# Patient Record
Sex: Female | Born: 1980 | Race: Black or African American | Hispanic: No | Marital: Single | Smoking: Never smoker
Health system: Southern US, Community
[De-identification: ages and names within clinical notes are randomized; demographics above are authoritative.]

## PROBLEM LIST (undated history)

## (undated) DIAGNOSIS — D649 Anemia, unspecified: Secondary | ICD-10-CM

## (undated) DIAGNOSIS — K219 Gastro-esophageal reflux disease without esophagitis: Secondary | ICD-10-CM

## (undated) DIAGNOSIS — T7840XA Allergy, unspecified, initial encounter: Secondary | ICD-10-CM

## (undated) DIAGNOSIS — F419 Anxiety disorder, unspecified: Secondary | ICD-10-CM

## (undated) HISTORY — DX: Allergy, unspecified, initial encounter: T78.40XA

## (undated) HISTORY — DX: Anemia, unspecified: D64.9

## (undated) HISTORY — DX: Gastro-esophageal reflux disease without esophagitis: K21.9

## (undated) HISTORY — PX: MOUTH SURGERY: SHX715

---

## 2010-06-02 ENCOUNTER — Emergency Department (HOSPITAL_COMMUNITY): Admission: EM | Admit: 2010-06-02 | Discharge: 2010-06-03 | Payer: Self-pay | Admitting: Emergency Medicine

## 2011-11-03 ENCOUNTER — Ambulatory Visit: Payer: Self-pay

## 2011-11-03 DIAGNOSIS — J111 Influenza due to unidentified influenza virus with other respiratory manifestations: Secondary | ICD-10-CM

## 2012-02-10 ENCOUNTER — Ambulatory Visit: Payer: BC Managed Care – PPO | Admitting: Family Medicine

## 2012-02-10 VITALS — BP 123/83 | HR 86 | Temp 97.4°F | Resp 18 | Ht 65.5 in | Wt 104.8 lb

## 2012-02-10 DIAGNOSIS — R634 Abnormal weight loss: Secondary | ICD-10-CM

## 2012-02-10 DIAGNOSIS — R5383 Other fatigue: Secondary | ICD-10-CM

## 2012-02-10 LAB — POCT CBC
HCT, POC: 35.5 % — AB (ref 37.7–47.9)
MCH, POC: 30.1 pg (ref 27–31.2)
MCV: 94.4 fL (ref 80–97)
MID (cbc): 0.3 (ref 0–0.9)
Platelet Count, POC: 285 10*3/uL (ref 142–424)
RBC: 3.76 M/uL — AB (ref 4.04–5.48)
WBC: 6.2 10*3/uL (ref 4.6–10.2)

## 2012-02-10 NOTE — Progress Notes (Signed)
Patient Name: Jodi Bishop Date of Birth: [birth year] Medical Record Number: [id number] Gender: female Date of Encounter: 02/10/2012  History of Present Illness:  Jodi Bishop is a 31 y.o. very pleasant female patient who presents with the following:  Notes that her energy had "bottomed out" for 3 or 4 days.  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  No other symptoms such as fever, cough, ST.  She did note a touch of diarrhea but thinks this may be due to he activia yogurt.  She has been working overtime for the past few weeks due to tax time.  Things are now calming down at her job.    Yesterday she noted that her arms felt weak and tingly, her legs felt weak for a while after she arrived at work.  Her hands were shaking.  She had not eaten breakfast yet yesterday when she had her symptoms.    No health problems in general that she is aware of.   Here today with her father.  He points out that she stopped eating meat (still eats fish) about a year ago and has lost 10lbs since then.    Menses started 2 days ago and continue now.   There is no problem list on file for this patient.  No past medical history on file. No past surgical history on file. History  Substance Use Topics  . Smoking status: Never Smoker   . Smokeless tobacco: Not on file  . Alcohol Use: Not on file   No family history on file. No Known Allergies  Medication list has been reviewed and updated.  Review of Systems: As per HPI- otherwise negative.   Physical Examination: Filed Vitals:   02/10/12 1048  BP: 123/83  Pulse: 86  Temp: 97.4 F (36.3 C)  TempSrc: Oral  Resp: 18  Height: 5' 5.5" (1.664 m)  Weight: 104 lb 12.8 oz (47.537 kg)    Body mass index is 17.17 kg/(m^2).  GEN: WDWN, NAD, Non-toxic, A & O x 3, very slim build HEENT: Atraumatic, Normocephalic. Neck supple. No masses, No LAD.  TM, oropharynx wnl.  Ears and Nose: No external deformity. CV: RRR, No M/G/R. No JVD. No  thrill. No extra heart sounds. PULM: CTA B, no wheezes, crackles, rhonchi. No retractions. No resp. distress. No accessory muscle use. ABD: S, NT, ND, +BS. No rebound. No HSM. EXTR: No c/c/e NEURO Normal gait. Normal strength, tone, sensation and DTR all extremities PSYCH: Normally interactive. Conversant. Not depressed or anxious appearing.  Calm demeanor.   Results for orders placed in visit on 02/10/12  POCT CBC      Component Value Range   WBC 6.2  4.6 - 10.2 (K/uL)   Lymph, poc 2.6  0.6 - 3.4    POC LYMPH PERCENT 42.1  10 - 50 (%L)   MID (cbc) 0.3  0 - 0.9    POC MID % 4.9  0 - 12 (%M)   POC Granulocyte 3.3  2 - 6.9    Granulocyte percent 53.0  37 - 80 (%G)   RBC 3.76 (*) 4.04 - 5.48 (M/uL)   Hemoglobin 11.3 (*) 12.2 - 16.2 (g/dL)   HCT, POC 40.9 (*) 81.1 - 47.9 (%)   MCV 94.4  80 - 97 (fL)   MCH, POC 30.1  27 - 31.2 (pg)   MCHC 31.8  31.8 - 35.4 (g/dL)   RDW, POC 91.4     Platelet Count, POC 285  142 -  424 (K/uL)   MPV 7.1  0 - 99.8 (fL)  GLUCOSE, POCT (MANUAL RESULT ENTRY)      Component Value Range   POC Glucose 121    POCT GLYCOSYLATED HEMOGLOBIN (HGB A1C)      Component Value Range   Hemoglobin A1C 5.1      Assessment and Plan: 1. Fatigue  POCT CBC, POCT glucose (manual entry)  2. Weight loss  POCT glycosylated hemoglobin (Hb A1C)   Suspect fatigue and weight loss may be due to increased work and lack of nutrition.  Encouraged more frequent meals with plenty of protein.  She asked to be out of work today and tomorrow to rest and this seems reasonable.  Mild anemia may be diet or menses related- encouraged more iron in her diet.  Lazariah will call or come back again if her symptoms are not resolved in the next week or two- at that point further lab evaluation may be necessary

## 2012-02-15 ENCOUNTER — Ambulatory Visit: Payer: BC Managed Care – PPO | Admitting: Family Medicine

## 2012-02-15 VITALS — BP 120/82 | HR 86 | Temp 98.7°F | Resp 20 | Ht 66.5 in | Wt 107.0 lb

## 2012-02-15 DIAGNOSIS — R5383 Other fatigue: Secondary | ICD-10-CM

## 2012-02-15 DIAGNOSIS — R5381 Other malaise: Secondary | ICD-10-CM

## 2012-02-15 LAB — POCT URINE PREGNANCY: Preg Test, Ur: NEGATIVE

## 2012-02-15 NOTE — Progress Notes (Signed)
  Patient Name: Jodi Bishop Date of Birth: 05/06/1981 Medical Record Number: [id number] Gender: female Date of Encounter: 02/15/2012  History of Present Illness:  Jodi Bishop is a 31 y.o. very pleasant female patient who presents with the following:  Here to recheck fatigue today- see OV last week.  She has continued to feel very fatigued.  She was here a few days ago and had a limited evaluation due to concern over cost.  Since her last visit Jodi Bishop continues to feel fatigued although she had felt better today.  She went to work today but felt very tired and came home after 30 minutes due to feeling "lightheaded."  It seems that her symptoms always get worse when she arrives at her job. In fact, as long as she is at home she seems to feel ok.  She denies feeling anxious or overly stressed about her work.    No CP, no palpitations.  Here today with her father again.    There is no problem list on file for this patient.  No past medical history on file. No past surgical history on file. History  Substance Use Topics  . Smoking status: Never Smoker   . Smokeless tobacco: Not on file  . Alcohol Use: Not on file   No family history on file. No Known Allergies  Medication list has been reviewed and updated.  Review of Systems: As per HPI- otherwise negative. Has been working on eating more- especially eating breakfast  Physical Examination: Filed Vitals:   02/15/12 1740  BP: 115/72  Pulse: 92  Temp: 98.7 F (37.1 C)  TempSrc: Oral  Resp: 20  Height: 5' 6.5" (1.689 m)  Weight: 107 lb (48.535 kg)    Body mass index is 17.01 kg/(m^2).  GEN: WDWN, NAD, Non-toxic, A & O x 3- looks very well, cheerful and laughing  HEENT: Atraumatic, Normocephalic. Neck supple. No masses, No LAD. Ears and Nose: No external deformity. CV: RRR, No M/G/R. No JVD. No thrill. No extra heart sounds. PULM: CTA B, no wheezes, crackles, rhonchi. No retractions. No resp. distress. No accessory muscle  use. ABD: S, NT, ND, +BS. No rebound. No HSM. EXTR: No c/c/e NEURO Normal gait.  PSYCH: Normally interactive. Conversant. Not depressed or anxious appearing.  Calm demeanor.   See orthostatics  Results for orders placed in visit on 02/15/12  POCT URINE PREGNANCY      Component Value Range   Preg Test, Ur Negative      Assessment and Plan: 1. Fatigue  POCT urine pregnancy, Comprehensive metabolic panel, TSH, Epstein-Barr virus VCA antibody panel   Jaeleigh's symptoms really seem to be tied to her job- this makes me wonder if anxiety is playing a role.  Will further evaluate as above.  Did give her a note for the several days of work that she missed last week.  She will try and return to work tomorrow for at least a half day- plan half days tomorrow and Wednesday, then return to full days.  If she gets worse in the meantime please call.

## 2012-02-16 LAB — COMPREHENSIVE METABOLIC PANEL
ALT: 14 U/L (ref 0–35)
AST: 19 U/L (ref 0–37)
Albumin: 4.9 g/dL (ref 3.5–5.2)
CO2: 23 mEq/L (ref 19–32)
Calcium: 9.9 mg/dL (ref 8.4–10.5)
Chloride: 104 mEq/L (ref 96–112)
Creat: 0.68 mg/dL (ref 0.50–1.10)
Potassium: 3.9 mEq/L (ref 3.5–5.3)
Sodium: 139 mEq/L (ref 135–145)
Total Protein: 8.6 g/dL — ABNORMAL HIGH (ref 6.0–8.3)

## 2012-02-16 LAB — TSH: TSH: 1.176 u[IU]/mL (ref 0.350–4.500)

## 2012-02-22 ENCOUNTER — Ambulatory Visit: Payer: BC Managed Care – PPO | Admitting: Internal Medicine

## 2012-02-22 ENCOUNTER — Telehealth: Payer: Self-pay

## 2012-02-22 VITALS — BP 114/77 | HR 80 | Temp 98.2°F | Resp 16 | Ht 66.0 in | Wt 107.4 lb

## 2012-02-22 DIAGNOSIS — R5381 Other malaise: Secondary | ICD-10-CM

## 2012-02-22 DIAGNOSIS — R5383 Other fatigue: Secondary | ICD-10-CM

## 2012-02-22 DIAGNOSIS — D649 Anemia, unspecified: Secondary | ICD-10-CM

## 2012-02-22 LAB — POCT CBC
Granulocyte percent: 48.6 %G (ref 37–80)
Lymph, poc: 2.8 (ref 0.6–3.4)
MCHC: 31.3 g/dL — AB (ref 31.8–35.4)
MID (cbc): 0.5 (ref 0–0.9)
POC Granulocyte: 3.1 (ref 2–6.9)
POC LYMPH PERCENT: 43 %L (ref 10–50)
POC MID %: 8.4 %M (ref 0–12)
Platelet Count, POC: 369 10*3/uL (ref 142–424)
RDW, POC: 13.9 %

## 2012-02-22 MED ORDER — VENLAFAXINE HCL ER 75 MG PO CP24: 75.0000 mg | ORAL_CAPSULE | Freq: Every day | ORAL | Status: DC

## 2012-02-22 NOTE — Progress Notes (Signed)
  Subjective:    Patient ID: Jodi Bishop, female    DOB: 1980-10-30, 31 y.o.   MRN: [id number]  HPI  Jodi Bishop is here with her father today stating that she is having episodes of her energy going up and down. This is her third visit for this problem.   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  She denies insomnia, anhedonia,relationship changes, death in the family/pet, or any grief reactions. Sometimes she has moments of feeling lightheaded.   She does not take iron but has been eating a lot of iron rich foods and spinach.  She is interactive, smiles, has good eye contract during our discussion.  She has no idea why she is so tired, but she notes it started after one long day April 15th and has been recurring since then.   Review of Systems  Constitutional: Positive for fatigue. Negative for fever, activity change and appetite change.  Respiratory: Negative.   Cardiovascular: Negative.   Gastrointestinal: Negative.   Genitourinary: Negative.   Musculoskeletal: Negative.   Neurological: Positive for light-headedness.  Hematological: Negative.   Psychiatric/Behavioral: Negative.   All other systems reviewed and are negative.       Objective:   Physical Exam  Vitals reviewed. Constitutional: She is oriented to person, place, and time. She appears well-developed and well-nourished.       Thin, with BMI 17.  HENT:  Head: Normocephalic.  Eyes: Conjunctivae are normal. Pupils are equal, round, and reactive to light.  Neck: Neck supple.  Cardiovascular: Normal rate, regular rhythm and normal heart sounds.   Pulmonary/Chest: Effort normal and breath sounds normal.  Abdominal: Soft.  Neurological: She is alert and oriented to person, place, and time.  Skin: Skin is warm and dry.  Psychiatric: She has a normal mood and affect. Her behavior is normal. Judgment and thought content normal.          Assessment & Plan:  CBC and Ferritin done  today. Mild anemia with a very slight improvement in her HGB to 11.5.  Disregard in-house ferritin ordered, this test will be sent out to the lab for evaluation. Fatigue that is episodic and from her history occuring in her work place mostly.  Occasionally she has lightheadedness but there is no family history of MS, or vision changes.  One aunt has lupus.  OOW note is written from 02/16/12-02/22/12. She is advised to come into the clinic if she misses any more work as we cannot write her out for a missed week after the fact.  Pt should start Effexor 75 mg daily and return to work tomorrow.  If not improved RTC to see Dr. Patsy Lager in 2-4 weeks.  AVS printed and given pt.

## 2012-02-22 NOTE — Telephone Encounter (Signed)
Can we give this note to her?

## 2012-02-22 NOTE — Telephone Encounter (Signed)
Called Jodi Bishop back and talked to her.  She was last seen here a week ago, and when I called to follow-up and discuss labs she had said that she was feeling better.  However, she still has not returned to work and states that her energy has "peaks and valleys" and that she has not felt well enough to go to work.  I advised her that if she is still too ill to return to work we really need to see her and be sure there is nothing else going on that is making her so ill.  We need to re-evalute her before we can give her a note excusing her from the last 2 weeks of work.  She agreed to [facility]

## 2012-02-22 NOTE — Telephone Encounter (Signed)
PT STATES SHE WOULD LIKE TO HAVE A NOTE FAXED OVER TO HER JOB EXCUSING HER FROM WORK ON 4-16, 4-17, 4-18, 4-19 AND 4-22 UNTIL 4-26 AND ALSO FOR TODAY STATES DR COPLAND IS AWARE OF IT. PLEASE CALL PT AT [phone] AND THE FAX IS [phone] ATTN: KIM CRADDOCK

## 2012-02-22 NOTE — Patient Instructions (Signed)
Take your Effexor 75 mg every morning with some food.  Recheck with Dr. Patsy Lager in 2-4 weeks.

## 2012-02-23 LAB — HIV ANTIBODY (ROUTINE TESTING W REFLEX): HIV: NONREACTIVE

## 2012-02-24 ENCOUNTER — Telehealth: Payer: Self-pay | Admitting: Radiology

## 2012-02-24 NOTE — Telephone Encounter (Signed)
Message copied by Luretha Murphy on Wed Feb 24, 2012 11:27 AM ------      Message from: Eddie Candle E      Created: Wed Feb 24, 2012  8:53 AM       Please tell patient her iron is very low, only 10 and I would like her to try an iron supplement or multivitamin with iron to improve her energy level.  Her HIV test was negative and other labs were normal.  thanks

## 2012-02-24 NOTE — Telephone Encounter (Signed)
Gave pt labs.

## 2012-03-21 ENCOUNTER — Ambulatory Visit: Payer: BC Managed Care – PPO | Admitting: Family Medicine

## 2012-03-21 VITALS — BP 101/69 | HR 80 | Temp 98.1°F | Resp 16 | Ht 66.5 in | Wt 108.0 lb

## 2012-03-21 DIAGNOSIS — R5381 Other malaise: Secondary | ICD-10-CM

## 2012-03-21 DIAGNOSIS — R5383 Other fatigue: Secondary | ICD-10-CM

## 2012-03-21 LAB — POCT CBC
Granulocyte percent: 55.8 %G (ref 37–80)
Hemoglobin: 10.9 g/dL — AB (ref 12.2–16.2)
MCH, POC: 30.8 pg (ref 27–31.2)
MPV: 8.3 fL (ref 0–99.8)
POC MID %: 7.5 %M (ref 0–12)
Platelet Count, POC: 347 10*3/uL (ref 142–424)
RBC: 3.54 M/uL — AB (ref 4.04–5.48)
WBC: 6 10*3/uL (ref 4.6–10.2)

## 2012-03-21 NOTE — Progress Notes (Signed)
Patient Name: Jodi Bishop Date of Birth: May 30, 1981 Medical Record Number: [id number] Gender: female Date of Encounter: 03/21/2012  History of Present Illness:  Jodi Bishop is a 31 y.o. very pleasant female patient who presents with the following:  Here today to recheck her fatigue.  She never did start the effexor she was given about a month ago.  She notes that she is doing "a lot better."  She occasionally will feel that she is a little lightheaded or tired.  Today (a holiday) she feels "really good."  She has started an iron supplement (actually an "iron water" that does not seem to have a lot of actual iron), and is trying to eat more healthfully.  However, she does note that these symptoms seems to coincide with when she cut most meat out of her diet.  We will recheck her hemoglobin level today.    She also has "papers for her job" for me to fill out.  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 She notes that her "head felt funny" when she first started the iron supplement.  She never passed out but felt faint.  This seems to have improved.  However, she continues to have symptoms of fatigue off an on.  Her new supervisor is "more strict about these things" than her last supervisor, so her need to rest while at work is becoming more of an issue.   She also had to go to HR last Wednesday due to "feeling horrible" and needing to rest longer than he lunch break allowed. She is here to have paperwork filled out "in case I have to step away from the job for a little while."  She does not notice these symptoms when she is at home.  In fact, this weekend she has felt quite well and today "I feel the best I have felt since this started."   LMP last week  There is no problem list on file for this patient.  No past medical history on file. No past surgical history on file. History  Substance Use Topics  . Smoking status: Never Smoker   . Smokeless  tobacco: Not on file  . Alcohol Use: Not on file   No family history on file. No Known Allergies  Medication list has been reviewed and updated.  Review of Systems: As per HPI- otherwise negative.   Physical Examination: Filed Vitals:   03/21/12 1454  BP: 101/69  Pulse: 80  Temp: 98.1 F (36.7 C)  TempSrc: Oral  Resp: 16  Height: 5' 6.5" (1.689 m)  Weight: 108 lb (48.988 kg)    Body mass index is 17.17 kg/(m^2).  GEN: WDWN, NAD, Non-toxic, A & O x 3, talkative, cheerful HEENT: Atraumatic, Normocephalic. Neck supple. No masses, No LAD. Ears and Nose: No external deformity. CV: RRR, No M/G/R. No JVD. No thrill. No extra heart sounds. PULM: CTA B, no wheezes, crackles, rhonchi. No retractions. No resp. distress. No accessory muscle use. EXTR: No c/c/e NEURO Normal gait.  PSYCH: Normally interactive. Conversant. Not depressed or anxious appearing.    Results for orders placed in visit on 03/21/12  POCT CBC      Component Value Range   WBC 6.0  4.6 - 10.2 (K/uL)   Lymph, poc 2.2  0.6 - 3.4    POC LYMPH PERCENT 36.7  10 - 50 (%L)   MID (cbc) 0.4  0 - 0.9  POC MID % 7.5  0 - 12 (%M)   POC Granulocyte 3.3  2 - 6.9    Granulocyte percent 55.8  37 - 80 (%G)   RBC 3.54 (*) 4.04 - 5.48 (M/uL)   Hemoglobin 10.9 (*) 12.2 - 16.2 (g/dL)   HCT, POC 45.4 (*) 09.8 - 47.9 (%)   MCV 95.4  80 - 97 (fL)   MCH, POC 30.8  27 - 31.2 (pg)   MCHC 32.2  31.8 - 35.4 (g/dL)   RDW, POC 11.9     Platelet Count, POC 347  142 - 424 (K/uL)   MPV 8.3  0 - 99.8 (fL)    Assessment and Plan: She still has a slight anemia. I do not think that the iron water she is taking is probably sufficient- recommended that she start a regular iron supplement of 325mg / day which is available OTC.  Recommend that she start eating meat again as this may help to improve her energy level. It is concerning to me that thse symptoms only seem to occur when she is at work- consider anxiety attacks. I wish that she had  tried the effexor she was given.  Did complete her paperwork so that she may miss up to 2 hours per week at work.  This will expire on June 30th- explained to her that if her symptoms continue to cause her to miss work we will need to evaluate her further.  Plan to recheck in about one month to see how her iron level is doing with a standard supplement. Sooner if worse.

## 2012-03-23 ENCOUNTER — Telehealth: Payer: Self-pay | Admitting: *Deleted

## 2012-03-23 NOTE — Telephone Encounter (Signed)
Per Dr. Patsy Lager, called patient and left a message regarding FMLA form for additional time off.  Per their conversation at office visit yesterday (03/22/12), Dr. Patsy Lager re-emphasized she would ok up to 2 hours per week for one month.  Patient is to call the office back if she has any questions.  Angie Calandria Mullings, CMA.

## 2013-01-27 ENCOUNTER — Ambulatory Visit: Payer: No Typology Code available for payment source | Admitting: Internal Medicine

## 2013-01-27 VITALS — BP 130/76 | HR 71 | Temp 98.0°F | Resp 18 | Wt 127.0 lb

## 2013-01-27 DIAGNOSIS — R5381 Other malaise: Secondary | ICD-10-CM

## 2013-01-27 DIAGNOSIS — B349 Viral infection, unspecified: Secondary | ICD-10-CM

## 2013-01-27 DIAGNOSIS — D649 Anemia, unspecified: Secondary | ICD-10-CM

## 2013-01-27 DIAGNOSIS — B9789 Other viral agents as the cause of diseases classified elsewhere: Secondary | ICD-10-CM

## 2013-01-27 LAB — POCT CBC
HCT, POC: 39.8 % (ref 37.7–47.9)
Lymph, poc: 2.3 (ref 0.6–3.4)
MCH, POC: 31.5 pg — AB (ref 27–31.2)
MCHC: 31.7 g/dL — AB (ref 31.8–35.4)
MCV: 99.5 fL — AB (ref 80–97)
MID (cbc): 0.4 (ref 0–0.9)
POC LYMPH PERCENT: 34.5 %L (ref 10–50)
Platelet Count, POC: 320 10*3/uL (ref 142–424)
RDW, POC: 12.5 %
WBC: 6.6 10*3/uL (ref 4.6–10.2)

## 2013-01-27 NOTE — Progress Notes (Signed)
  Subjective:    Patient ID: Jodi Bishop, female    DOB: 20-Jul-1981, 32 y.o.   MRN: [id number]  HPI C/o 1 week of head congestion and fatigue.Hx of anemia and on iron reolacement. No sob, cp, dizzyness.   Review of Systems normal    Objective:   Physical Exam  Vitals reviewed. Constitutional: She is oriented to person, place, and time. She appears well-developed and well-nourished.  HENT:  Right Ear: External ear normal.  Left Ear: External ear normal.  Nose: Nose normal.  Mouth/Throat: Oropharynx is clear and moist.  Eyes: EOM are normal. Pupils are equal, round, and reactive to light.  Neck: Neck supple. No thyromegaly present.  Cardiovascular: Normal rate, regular rhythm and normal heart sounds.   Lymphadenopathy:    She has no cervical adenopathy.  Neurological: She is alert and oriented to person, place, and time. She has normal reflexes. She exhibits normal muscle tone. Coordination normal.  Psychiatric: She has a normal mood and affect. Her behavior is normal.     Results for orders placed in visit on 01/27/13  POCT CBC      Result Value Range   WBC 6.6  4.6 - 10.2 K/uL   Lymph, poc 2.3  0.6 - 3.4   POC LYMPH PERCENT 34.5  10 - 50 %L   MID (cbc) 0.4  0 - 0.9   POC MID % 6.4  0 - 12 %M   POC Granulocyte 3.9  2 - 6.9   Granulocyte percent 59.1  37 - 80 %G   RBC 4.00 (*) 4.04 - 5.48 M/uL   Hemoglobin 12.6  12.2 - 16.2 g/dL   HCT, POC 40.9  81.1 - 47.9 %   MCV 99.5 (*) 80 - 97 fL   MCH, POC 31.5 (*) 27 - 31.2 pg   MCHC 31.7 (*) 31.8 - 35.4 g/dL   RDW, POC 91.4     Platelet Count, POC 320  142 - 424 K/uL   MPV 7.9  0 - 99.8 fL   Anemia resolved     Assessment & Plan:  Fatigue/Post viral syndrome DC iron

## 2013-01-27 NOTE — Patient Instructions (Signed)

## 2013-01-30 ENCOUNTER — Encounter: Payer: Self-pay | Admitting: *Deleted

## 2013-02-16 ENCOUNTER — Emergency Department (HOSPITAL_COMMUNITY)
Admission: EM | Admit: 2013-02-16 | Discharge: 2013-02-16 | Disposition: A | Discharge status: Home / Self Care | Payer: No Typology Code available for payment source | Attending: Emergency Medicine | Admitting: Emergency Medicine

## 2013-02-16 ENCOUNTER — Encounter (HOSPITAL_COMMUNITY): Payer: Self-pay

## 2013-02-16 ENCOUNTER — Emergency Department (HOSPITAL_COMMUNITY): Payer: No Typology Code available for payment source

## 2013-02-16 ENCOUNTER — Other Ambulatory Visit: Payer: Self-pay

## 2013-02-16 DIAGNOSIS — R0602 Shortness of breath: Secondary | ICD-10-CM | POA: Insufficient documentation

## 2013-02-16 DIAGNOSIS — R079 Chest pain, unspecified: Secondary | ICD-10-CM

## 2013-02-16 DIAGNOSIS — R0789 Other chest pain: Secondary | ICD-10-CM | POA: Insufficient documentation

## 2013-02-16 DIAGNOSIS — R000 Tachycardia, unspecified: Secondary | ICD-10-CM | POA: Insufficient documentation

## 2013-02-16 LAB — POCT I-STAT TROPONIN I

## 2013-02-16 LAB — BASIC METABOLIC PANEL
BUN: 13 mg/dL (ref 6–23)
GFR calc Af Amer: >90 mL/min (ref >90)
GFR calc non Af Amer: >90 mL/min (ref >90)
Potassium: 3.2 mEq/L — ABNORMAL LOW (ref 3.5–5.1)

## 2013-02-16 LAB — CBC
HCT: 36 % (ref 36.0–46.0)
MCHC: 36.4 g/dL — ABNORMAL HIGH (ref 30.0–36.0)
RDW: 11.1 % — ABNORMAL LOW (ref 11.5–15.5)

## 2013-02-16 MED ORDER — GI COCKTAIL ~~LOC~~
30.0000 mL | Freq: Once | ORAL | Status: AC
  Administered 2013-02-16: 30 mL via ORAL
  Filled 2013-02-16: qty 30

## 2013-02-16 MED ORDER — FAMOTIDINE 20 MG PO TABS: 20.0000 mg | ORAL_TABLET | Freq: Two times a day (BID) | ORAL | Status: DC

## 2013-02-16 MED ORDER — LORAZEPAM 2 MG/ML IJ SOLN: 1.0000 mg | Freq: Once | INTRAMUSCULAR | Status: DC

## 2013-02-16 NOTE — ED Provider Notes (Signed)
32 year old female, history of chest pain that came on today, has self resolved, no respiratory cardiac disease or pulmonary wasn't, d-dimer normal, labs normal, EKG unremarkable. The transfer from urgent care for mild tachycardia.  On exam patient is clear heart and lung sounds, no tachycardia, appears mildly anxious and states that she gets nervous causes her rate to go up, no signs of peripheral edema, patient stable for discharge.  PA and lateral views of the chest were obtained by digital radiography. I have personally interpreted these x-rays and find her to be no signs of pulmonary infiltrate, cardiomegaly, subdiaphragmatic free air, soft tissue abnormality, no obvious bony abnormalities or fractures.   Medical screening examination/treatment/procedure(s) were conducted as a shared visit with non-physician practitioner(s) and myself.  I personally evaluated the patient during the encounter    Vida Roller, MD 02/16/13 2011

## 2013-02-16 NOTE — ED Notes (Addendum)
Pt was at work earlier today and began to experience chest pain that started about one hour after she ate lunch. Pt said the pain is in the central part of her chest now but when the pain began she felt the pain up the front of her neck as well. No vomiting or diarrhea. Pt did experience some shortness of breath with the chest pain. [facility] and they told her to come here.

## 2013-02-16 NOTE — ED Notes (Signed)
Pt resting quietly at this time with no complaints voiced.  Skin warm and dry color appropriated.

## 2013-02-16 NOTE — Discharge Instructions (Signed)
Your work up here is unremarkable. Tylenol for pain. Take pepcid daily for possible gastrointestinal symptoms. If symptoms continue follow up with primary care doctor.    Chest Pain (Nonspecific) It is often hard to give a specific diagnosis for the cause of chest pain. There is always a chance that your pain could be related to something serious, such as a heart attack or a blood clot in the lungs. You need to follow up with your caregiver for further evaluation. CAUSES   Heartburn.  Pneumonia or bronchitis.  Anxiety or stress.  Inflammation around your heart (pericarditis) or lung (pleuritis or pleurisy).  A blood clot in the lung.  A collapsed lung (pneumothorax). It can develop suddenly on its own (spontaneous pneumothorax) or from injury (trauma) to the chest.  Shingles infection (herpes zoster virus). The chest wall is composed of bones, muscles, and cartilage. Any of these can be the source of the pain.  The bones can be bruised by injury.  The muscles or cartilage can be strained by coughing or overwork.  The cartilage can be affected by inflammation and become sore (costochondritis). DIAGNOSIS  Lab tests or other studies, such as X-rays, electrocardiography, stress testing, or cardiac imaging, may be needed to find the cause of your pain.  TREATMENT   Treatment depends on what may be causing your chest pain. Treatment may include:  Acid blockers for heartburn.  Anti-inflammatory medicine.  Pain medicine for inflammatory conditions.  Antibiotics if an infection is present.  You may be advised to change lifestyle habits. This includes stopping smoking and avoiding alcohol, caffeine, and chocolate.  You may be advised to keep your head raised (elevated) when sleeping. This reduces the chance of acid going backward from your stomach into your esophagus.  Most of the time, nonspecific chest pain will improve within 2 to 3 days with rest and mild pain medicine. HOME  CARE INSTRUCTIONS   If antibiotics were prescribed, take your antibiotics as directed. Finish them even if you start to feel better.  For the next few days, avoid physical activities that bring on chest pain. Continue physical activities as directed.  Do not smoke.  Avoid drinking alcohol.  Only take over-the-counter or prescription medicine for pain, discomfort, or fever as directed by your caregiver.  Follow your caregiver's suggestions for further testing if your chest pain does not go away.  Keep any follow-up appointments you made. If you do not go to an appointment, you could develop lasting (chronic) problems with pain. If there is any problem keeping an appointment, you must call to reschedule. SEEK MEDICAL CARE IF:   You think you are having problems from the medicine you are taking. Read your medicine instructions carefully.  Your chest pain does not go away, even after treatment.  You develop a rash with blisters on your chest. SEEK IMMEDIATE MEDICAL CARE IF:   You have increased chest pain or pain that spreads to your arm, neck, jaw, back, or abdomen.  You develop shortness of breath, an increasing cough, or you are coughing up blood.  You have severe back or abdominal pain, feel nauseous, or vomit.  You develop severe weakness, fainting, or chills.  You have a fever. THIS IS AN EMERGENCY. Do not wait to see if the pain will go away. Get medical help at once. Call your local emergency services (911 in U.S.). Do not drive yourself to the hospital. MAKE SURE YOU:   Understand these instructions.  Will watch your condition.  Will get help right away if you are not doing well or get worse. Document Released: 07/22/2005 Document Revised: 01/04/2012 Document Reviewed: 05/17/2008 [facility] Patient Information 2013 [place], [place]. Resolved  RESOURCE GUIDE  Chronic Pain Problems: Contact [facility]  [phone] Patients need to be referred by  their primary care doctor.  Insufficient Money for Medicine: Contact United Way:  call "211."   No Primary Care Doctor: - Call Health Connect  [phone] - can help you locate a primary care doctor that  accepts your insurance, provides certain services, etc. - Physician Referral Service- [phone]  Agencies that provide inexpensive medical care: - [facility]  [phone] - [facility]  [phone] - [facility]  [phone] - Women's Clinic  [phone] - Planned Parenthood  [phone] [facility]  [phone]  Medicaid-accepting [facility] Providers: - [facility]- [address]  [phone], Mon-Fri 9am-7pm, Sat 9am-1pm - [facility]- [address] [place], Suite [place]  [phone] - [facility]- [address], Suite [place]  [phone] [facility]- [address]  [phone] - Renaye Rakers- [address] [place], [address], [phone]  Only accepts [place] patients after they have their name  applied to their card  Self Pay (no insurance) in [place]: - Sickle Cell Patients: Dr Willey Blade, [facility]  [address] [place], [phone] - [facility]- [address] [place]  [phone]       [facility] [place]- [address]       -     [facility]- see information above (Speak to [employer] if you do not have insurance)       -  [facility]- [address],  [phone]       -  [facility]- [address], [phone]       -  Dr Julio Sicks-  [address], [place], [phone]       -  [facility] - [address] [phone]       -  [facility]- [address] [phone], also [address] [phone]       -    [facility]- [address] [place], [phone], 1st & 3rd  Saturday        every month, 10am-1pm  [facility] [address] [place], [place] [address] [phone]  [facility] [address] [place], [place] [address] [phone]  1) Find a Doctor and Pay Out of Pocket Although you won't have to find out who is covered by your insurance plan, it is a good idea to ask around and get recommendations. You will then need to call the office and see if the doctor you have chosen will accept you as a new patient and what types of options they offer for patients who are self-pay. Some doctors offer discounts or will set up payment plans for their patients who do not have insurance, but you will need to ask so you aren't surprised when you get to your appointment.  2) Contact Your Local Health Department Not all health departments have doctors that can see patients for sick visits, but many do, so it  is worth a call to see if yours does. If you don't know where your local health department is, you can check in your phone book. The CDC also has a tool to help you locate your state's health department, and many state websites also have listings of all of their local health departments.  3) Find a Walk-in Clinic If your illness is not likely to be very severe or complicated, you may want to try a walk in clinic. These are popping up all over the country in pharmacies, drugstores, and shopping centers. They're usually staffed by nurse practitioners or physician assistants that have been trained to treat common illnesses and complaints. They're usually fairly quick and inexpensive. However, if you have serious medical issues or chronic medical problems, these are probably not your best option  STD Testing - [facility] Department of [facility] [place], STD Clinic, [address] [place], phone [phone] or [phone].  Monday - Friday, call for an appointment. [facility] Department of [employer], STD Clinic, [place] [address], [place], phone [phone] or [phone].  Monday - Friday, call for an appointment.  Abuse/Neglect: [facility] Child Abuse Hotline (713)435-9294 [facility] Child Abuse Hotline 970-634-9214 (After Hours)  Emergency Shelter:  [facility] 657-002-0770  Maternity Homes: - [facility] [place] [facility] [phone] - [facility] [phone]  MRSA Hotline #:   (832) 142-3876  Dental Assistance If unable to pay or uninsured, contact:  [facility]. to become qualified for the adult dental clinic.  Patients with Medicaid: [facility] [phone] [address], [phone] [address] [phone]  If unable to pay, or uninsured, contact [facility] [phone] in [place], [phone] in [facility]) to become qualified for the adult dental clinic  [facility] [address] [place], [place] [address] [phone] [email or web address]  Other Proofreader Services: - [facility]- [address] [place], [place], [place], [address], [phone], Ext. 123, 2nd and 4th Thursday of the month at 6:30am.  10 clients each day by appointment, can sometimes see walk-in patients if someone does not show for an appointment. [facility]- [address] [place], [place], [address], [phone] - [facility] [address] [place], [place], [address], [phone] - [facility]- [phone] [facility]- [phone] [facility] Health [phone]       Behavioral Health Resources in the [facility]  Intensive Outpatient Programs: [facility]      [address] [place], [place] [phone] Both a day and evening program       [facility] Outpatient     [address]        [place], [place]  [address] [phone]         ADS: [facility] S[facility] [address] [place] [phone]  [facility] Mental Health ACCESS LINE: [phone] or [phone] [address] [place], [place] [address] [email or web address]   Substance Abuse Resources: - [facility] Drug Services  [phone] - [facility] [phone] - [facility] [phone] Floydene Flock [phone] - Residential & Outpatient Substance Abuse Program  [phone]  Psychological Services: [facility]  [phone] [facility] Services  [phone] - [facility], [phone] [place]. [address] [place], ACCESS LINE: [phone] or [phone], [email or web address]  Mobile Crisis Teams:                                        Therapeutic Alternatives         Mobile Crisis Care Unit [phone]             [facility] [address] [place] Otto [phone]                                         Interventionist [address] [place] [phone]  Self-Help/Support Groups: Mental Health Assoc. of [employer] of support groups [phone] (call for more info)   Narcotics Anonymous (NA) Caring Services [address] [place] - 2 meetings at this location  Residential Treatment Programs:  [facility]      [address]        [place]       [phone]         [facility] [address] [place] 010272 [place], [place]  [address] [phone]  [facility]  [address] [place], [place] [address] [phone] Admissions: 8am-3pm M-F  [facility]     [address]        [place], [place] [address]       [phone]         [facility] [address] [place], [place] [phone]  [facility] [address] [place], [place] [phone]  [facility] - Intensive Outpatient      [address]     [place], [place]       [phone]         [facility] ([facility]     [address] [place], [place] [phone] or [phone]  Residential Treatment Services (RTS), Medicaid [address] [place], [place] [phone]  [facility] [address]                                               [address] [place] [phone]  [facility] Resources: [phone]               General Therapy                                                Angie Fava, PhD        [address] [place], [place] [address]         [phone]   Insurance  [facility] Behavioral   [address] [place], [place] [address] [phone]  [facility] [address] [place], [place] [address] [phone] Insurance/Medicaid/sponsorship through [facility]  [facility] Families  [address]                                        [place], [place] [address]    Therapy/tele-psych/case         [phone]          [facility] [address], [place]  [address]  Adolescent/group home/case management [phone]                                           Creola Corn PhD       General therapy       Insurance   [phone]         Dr. Lolly Mustache, [place], M-F [phone]  [facility]  United [employer] [facility]. [address]                 [address]         [address] [place] [address]  Blondell Reveal Phone:  [phone]                                  Phone:  [phone]                   Phone:  [phone]  [facility] Mental Health, [phone] - [facility]  Human Services- [phone]       -     [facility] in [place], [address]             [phone], Insurance  [place] Child Abuse Hotline [phone]) [phone] or [phone] (After Hours)

## 2013-02-16 NOTE — ED Provider Notes (Signed)
History     CSN: [id number]  Arrival date & time 02/16/13  1634   First MD Initiated Contact with Patient 02/16/13 1722      Chief Complaint  Patient presents with  . Chest Pain    (Consider location/radiation/quality/duration/timing/severity/associated sxs/prior treatment) HPI Jodi Bishop is a 32 y.o. female who presents to ED with complaint of chest pain. States pain started while at work, states was just sitting at her computer. Onset was shortly after eating lunch. States pain in the retrosternal area, radiating up the neck.  States pain improved, but still feeling some pressure. States it was associated with shortness of breath. No nausea, diaphoresis, dizziness. States had symptoms similar to this before, also after eating. Denies prior medical problems. Not a smoker. Went to UC, sent here because was tachycardic and had chest pain. Pt denies recent travel, no recent surgeries, does not take OCPs.   Past Medical History  Diagnosis Date  . Allergy     History reviewed. No pertinent past surgical history.  Family History  Problem Relation Age of Onset  . Hypertension Father   . Diabetes Maternal Grandmother   . Cancer Maternal Grandfather     History  Substance Use Topics  . Smoking status: Never Smoker   . Smokeless tobacco: Not on file  . Alcohol Use: No    OB History   Grav Para Term Preterm Abortions TAB SAB Ect Mult Living                  Review of Systems  Respiratory: Positive for shortness of breath. Negative for chest tightness.   Cardiovascular: Positive for chest pain. Negative for palpitations and leg swelling.  Gastrointestinal: Negative.   Genitourinary: Negative for dysuria and flank pain.  Musculoskeletal: Negative.   Neurological: Negative for dizziness, weakness, light-headedness, numbness and headaches.  Psychiatric/Behavioral: Negative.   All other systems reviewed and are negative.    Allergies  Review of patient's allergies  indicates no known allergies.  Home Medications  No current outpatient prescriptions on file.  BP 139/88  Pulse 112  Temp(Src) 98.4 F (36.9 C) (Oral)  Resp 20  SpO2 96%  LMP 02/16/2013  Physical Exam  Nursing note and vitals reviewed. Constitutional: She is oriented to person, place, and time. She appears well-developed and well-nourished. No distress.  HENT:  Head: Normocephalic.  Eyes: Conjunctivae are normal.  Neck: Neck supple.  Cardiovascular: Normal rate, regular rhythm and normal heart sounds.   Pulmonary/Chest: Effort normal and breath sounds normal. No respiratory distress. She has no wheezes. She has no rales. She exhibits no tenderness.  Abdominal: Soft. Bowel sounds are normal. She exhibits no distension. There is no tenderness. There is no rebound.  Musculoskeletal: She exhibits no edema.  Neurological: She is alert and oriented to person, place, and time.  Skin: Skin is warm and dry.    ED Course  Procedures (including critical care time)   Date: 02/16/2013  Rate: 111  Rhythm: sinus tachycardia  QRS Axis: normal  Intervals: normal  ST/T Wave abnormalities: normal  Conduction Disutrbances:none  Narrative Interpretation:   Old EKG Reviewed: none available  Results for orders placed during the hospital encounter of 02/16/13  CBC      Result Value Range   WBC 11.3 (*) 4.0 - 10.5 K/uL   RBC 3.96  3.87 - 5.11 MIL/uL   Hemoglobin 13.1  12.0 - 15.0 g/dL   HCT 69.6  29.5 - 28.4 %   MCV 90.9  78.0 - 100.0 fL   MCH 33.1  26.0 - 34.0 pg   MCHC 36.4 (*) 30.0 - 36.0 g/dL   RDW 81.1 (*) 91.4 - 78.2 %   Platelets 269  150 - 400 K/uL  BASIC METABOLIC PANEL      Result Value Range   Sodium 137  135 - 145 mEq/L   Potassium 3.2 (*) 3.5 - 5.1 mEq/L   Chloride 103  96 - 112 mEq/L   CO2 25  19 - 32 mEq/L   Glucose, Bld 105 (*) 70 - 99 mg/dL   BUN 13  6 - 23 mg/dL   Creatinine, Ser 9.56  0.50 - 1.10 mg/dL   Calcium 21.3  8.4 - 08.6 mg/dL   GFR calc non Af Amer  >90  >90 mL/min   GFR calc Af Amer >90  >90 mL/min  D-DIMER, QUANTITATIVE      Result Value Range   D-Dimer, Quant <0.27  0.00 - 0.48 ug/mL-FEU  POCT I-STAT TROPONIN I      Result Value Range   Troponin i, poc 0.00  0.00 - 0.08 ng/mL   Comment 3            Dg Chest 2 View  02/16/2013  *RADIOLOGY REPORT*  Clinical Data: Chest pain.  CHEST - 2 VIEW  Comparison: None  Findings: Heart and mediastinal contours are within normal limits. No focal opacities or effusions.  No acute bony abnormality.  IMPRESSION: No active cardiopulmonary disease.   Original Report Authenticated By: Charlett Nose, M.D.     8:34 PM Pt's symptoms improved with GI cocktail. HR down into 80s. She is in no distress. Discussed with Dr.Miller, OK to d/c home with close follow up.   1. Chest pain       MDM  Pt with atypical chest pain that occurred after eating today. She was seen at [facility], found to be tachycardic. Sent here for further eval. Here she presents with sinus tachycardia at 112, BP normal. Her symptoms improved. Her labs including d dimer and set of enzymes all negative. Doubt CAD or PE. Her pain did respond to gi cocktail. She appears anxious. Her HR does improve when I step out of the room, monitor showed it to be persistently in 80s. It goes back up to 110s and 120s when someone goes into a room. Suspect possible anxiety and white coat syndrome. Discussed pt with Dr. Hyacinth Meeker who saw her as well. Ok to d/ c home with close follow up.   Filed Vitals:   02/16/13 1645 02/16/13 2000 02/16/13 2015  BP: 139/88 128/73 114/74  Pulse: 112 90 82  Temp: 98.4 F (36.9 C)    TempSrc: Oral    Resp: 20 12 14   SpO2: 96% 99% 100%           Lottie Mussel, PA-C 02/16/13 2039

## 2013-02-17 NOTE — ED Provider Notes (Signed)
  Medical screening examination/treatment/procedure(s) were conducted as a shared visit with non-physician practitioner(s) and myself.  I personally evaluated the patient during the encounter  Please see my separate respective documentation pertaining to this patient encounter   Jodi Batalla D Melinna Linarez, MD 02/17/13 0750 

## 2013-02-22 ENCOUNTER — Emergency Department (HOSPITAL_COMMUNITY)
Admission: EM | Admit: 2013-02-22 | Discharge: 2013-02-22 | Disposition: A | Discharge status: Home / Self Care | Payer: PRIVATE HEALTH INSURANCE | Attending: Emergency Medicine | Admitting: Emergency Medicine

## 2013-02-22 ENCOUNTER — Encounter (HOSPITAL_COMMUNITY): Payer: Self-pay | Admitting: *Deleted

## 2013-02-22 ENCOUNTER — Other Ambulatory Visit: Payer: Self-pay

## 2013-02-22 DIAGNOSIS — R0789 Other chest pain: Secondary | ICD-10-CM | POA: Insufficient documentation

## 2013-02-22 DIAGNOSIS — R079 Chest pain, unspecified: Secondary | ICD-10-CM

## 2013-02-22 LAB — CBC
HCT: 40.1 % (ref 36.0–46.0)
MCHC: 37.2 g/dL — ABNORMAL HIGH (ref 30.0–36.0)
MCV: 88.7 fL (ref 78.0–100.0)
RDW: 11.3 % — ABNORMAL LOW (ref 11.5–15.5)

## 2013-02-22 LAB — BASIC METABOLIC PANEL
BUN: 8 mg/dL (ref 6–23)
Calcium: 10.6 mg/dL — ABNORMAL HIGH (ref 8.4–10.5)
Creatinine, Ser: 0.57 mg/dL (ref 0.50–1.10)
GFR calc Af Amer: >90 mL/min (ref >90)
GFR calc non Af Amer: >90 mL/min (ref >90)
Potassium: 4.2 mEq/L (ref 3.5–5.1)

## 2013-02-22 NOTE — ED Notes (Signed)
Pt c/o right upper intermittent CP that started this morning, worsens when she tries to eat. Pt denies n/v/dizziness. Pt reports she has had a few episodes over the last week and was here on Thursday when they did bld work and CXR and was sent home, told everything looked normal. Pt denies injury to chest. PT reports lately work has become very stressful with a lot of new changes. Pt sts the CP mainly starts while she is at work. Pt sts she has some anxiety here and there but never dx or had a panic attack. Pt in nad, skin warm and dry, resp e/u.

## 2013-02-22 NOTE — ED Notes (Signed)
Pt reports being seen on Thursday for cp and was dc home. Pt reports cp has returned, that pain changes locations but most recently on right side of chest and radiates down her right arm. pts HR is 120-130 and was the same on Thursday. EKG being done, no acute distress noted.

## 2013-02-23 NOTE — ED Provider Notes (Signed)
History     CSN: [id number]  Arrival date & time 02/22/13  1636   First MD Initiated Contact with Patient 02/22/13 1826      Chief Complaint  Patient presents with  . Chest Pain    (Consider location/radiation/quality/duration/timing/severity/associated sxs/prior treatment) HPI  32 y/o female presents w/cc chest pain. Seen 1 week ago for the same. Thorough work up including d-dimer and Acs work up negative. Patient states taht she tried to follow up at [facility], however when she said she wanted to be followed up for her chest pain, they told her to go to the ED for evalusation. The Patient has had 2 transient episodes of chest pain. Both occurred at work. 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 BOth instances of chest pain occurred just after she ate. First instance was after 2 chilli dogs with hot sauce, 2nd instance was after a cheesburger pain lasted for about 20 minutes and felt like burning.  Patient states that she was unable to sleep last night had had chest pain again today without pressure, diaphoresis, nausea, or radiation.  Rated at 2/100. Patient was discharged with prevacid but has not taken it, nor tylenol or advil because she "does not like to take medicatons."   Past Medical History  Diagnosis Date  . Allergy     History reviewed. No pertinent past surgical history.  Family History  Problem Relation Age of Onset  . Hypertension Father   . Diabetes Maternal Grandmother   . Cancer Maternal Grandfather     History  Substance Use Topics  . Smoking status: Never Smoker   . Smokeless tobacco: Not on file  . Alcohol Use: No    OB History   Grav Para Term Preterm Abortions TAB SAB Ect Mult Living                  Review of Systems Ten systems reviewed and are negative for acute change, except as noted in the HPI.    Allergies  Review of patient's allergies indicates no known allergies.  Home Medications  No current outpatient  prescriptions on file.  BP 112/73  Pulse 79  Temp(Src) 98.2 F (36.8 C) (Oral)  Resp 13  SpO2 98%  LMP 02/16/2013  Physical Exam Physical Exam  Nursing note and vitals reviewed. Constitutional: She is oriented to person, place, and time. She is thin, anxious appearing, intermittently on the verge of tears. HENT:  Head: Normocephalic and atraumatic.  Eyes: Conjunctivae normal and EOM are normal. Pupils are equal, round, and reactive to light. No scleral icterus.  Neck: Normal range of motion.  Cardiovascular: Normal rate, regular rhythm and normal heart sounds.  Exam reveals no gallop and no friction rub.   No murmur heard. Pulmonary/Chest: Effort normal and breath sounds normal. No respiratory distress.  Abdominal: Soft. Bowel sounds are normal. She exhibits no distension and no mass. There is no tenderness. There is no guarding.  Neurological: She is alert and oriented to person, place, and time.  Skin: Skin is warm and dry. She is not diaphoretic.    ED Course  Procedures (including critical care time)  Labs Reviewed  CBC - Abnormal; Notable for the following:    MCHC 37.2 (*)    RDW 11.3 (*)    All other components within normal limits  BASIC METABOLIC PANEL - Abnormal; Notable for the following:    Calcium 10.6 (*)    All other components within normal limits  POCT I-STAT TROPONIN I   No results found.   1. Chest pain       MDM  Patient here las week with normal work up. Patient hear rate by palpateion was 97.  When the patient began speaking about her job and her complaints, she became somewhat tearful and her heart rate went up to 120 again.  Patient;s pain is very atypical and her risk factors for ACS are none. Patient was tachycardic at last visit and had negative d-dimer.Her pain is always minimal and she has done nothing to treat her symptoms. Patient was offered both tylenol and ativan but refused to take it.  Filed Vitals:   02/22/13 1830 02/22/13 1900  02/22/13 2000 02/22/13 2130  BP: 116/79 118/90  112/73  Pulse: 89 98 95 79  Temp:      TempSrc:      Resp:    13  SpO2: 100% 100% 98% 98%   VS stable . Follow up with primary care. The patient appears reasonably screened and/or stabilized for discharge and I doubt any other medical condition or other [facility] requiring further screening, evaluation, or treatment in the ED at this time prior to discharge.        Arthor Captain, PA-C 02/23/13 1858

## 2013-02-24 NOTE — ED Provider Notes (Signed)
Medical screening examination/treatment/procedure(s) were performed by non-physician practitioner and as supervising physician I was immediately available for consultation/collaboration.   Charles B. Bernette Mayers, MD 02/24/13 [phone]

## 2013-02-27 ENCOUNTER — Encounter (HOSPITAL_BASED_OUTPATIENT_CLINIC_OR_DEPARTMENT_OTHER): Payer: Self-pay | Admitting: *Deleted

## 2013-02-27 ENCOUNTER — Emergency Department (HOSPITAL_BASED_OUTPATIENT_CLINIC_OR_DEPARTMENT_OTHER)
Admission: EM | Admit: 2013-02-27 | Discharge: 2013-02-28 | Disposition: A | Discharge status: Home / Self Care | Payer: No Typology Code available for payment source | Attending: Emergency Medicine | Admitting: Emergency Medicine

## 2013-02-27 DIAGNOSIS — R202 Paresthesia of skin: Secondary | ICD-10-CM

## 2013-02-27 DIAGNOSIS — Z8659 Personal history of other mental and behavioral disorders: Secondary | ICD-10-CM | POA: Insufficient documentation

## 2013-02-27 DIAGNOSIS — R209 Unspecified disturbances of skin sensation: Secondary | ICD-10-CM | POA: Insufficient documentation

## 2013-02-27 HISTORY — DX: Anxiety disorder, unspecified: F41.9

## 2013-02-27 NOTE — ED Notes (Addendum)
Right sided facial numbness. Sudden onset 5 hours ago. No facial droop, no slurred speech of difficulty with speech or walking. States last week she was diagnosed with anxiety after having chest pain.

## 2013-02-28 ENCOUNTER — Emergency Department (HOSPITAL_BASED_OUTPATIENT_CLINIC_OR_DEPARTMENT_OTHER): Payer: No Typology Code available for payment source

## 2013-02-28 LAB — GLUCOSE, CAPILLARY: Glucose-Capillary: 106 mg/dL — ABNORMAL HIGH (ref 70–99)

## 2013-02-28 NOTE — ED Provider Notes (Signed)
History  This chart was scribed for Glynn Octave, MD by Shari Heritage, ED Scribe. The patient was seen in room MH04/MH04. Patient's care was started at 2357.   CSN: [id number]  Arrival date & time 02/27/13  2347   First MD Initiated Contact with Patient 02/27/13 2357      Chief Complaint  Patient presents with  . Numbness     The history is provided by the patient. No language interpreter was used.    HPI Comments: Jodi Bishop is a 32 y.o. female who presents to the Emergency Department complaining of improving, constant right sided facial numbness onset five hours ago. Patient states numbness began while she was sitting at home on the couch. She reports that it began at right forehead then moved to the right cheek. She denies prior history of the same. She denies any pain to the area.  There is no dizziness or lightheadedness. Patient denies speech difficulty, difficulty ambulating, weakness of extremities, blurred vision, double vision, leg pain or leg swelling. She states that she was worked up for chest pain and shortness of breath last week. Labs and x-rays were negative for acute cardiac or pulmonary conditions. Patient does not take any medicines on a regular basis.    Past Medical History  Diagnosis Date  . Allergy   . Anxiety     History reviewed. No pertinent past surgical history.  Family History  Problem Relation Age of Onset  . Hypertension Father   . Diabetes Maternal Grandmother   . Cancer Maternal Grandfather     History  Substance Use Topics  . Smoking status: Never Smoker   . Smokeless tobacco: Not on file  . Alcohol Use: No    OB History   Grav Para Term Preterm Abortions TAB SAB Ect Mult Living                  Review of Systems A complete 10 system review of systems was obtained and all systems are negative except as noted in the HPI and PMH.   Allergies  Review of patient's allergies indicates no known allergies.  Home Medications  No  current outpatient prescriptions on file.  Triage Vitals: BP 125/90  Pulse 81  Temp(Src) 98 F (36.7 C) (Oral)  Resp 20  Wt 127 lb (57.607 kg)  BMI 20.19 kg/m2  SpO2 97%  LMP 02/16/2013  Physical Exam  Constitutional: She is oriented to person, place, and time. She appears well-developed and well-nourished.  HENT:  Head: Normocephalic and atraumatic.  Eyes: Conjunctivae and EOM are normal. Pupils are equal, round, and reactive to light.  Neck: Normal range of motion. Neck supple.  Cardiovascular: Normal rate, regular rhythm and normal heart sounds.   Pulmonary/Chest: Effort normal and breath sounds normal.  Abdominal: Soft. There is no tenderness.  Musculoskeletal: Normal range of motion.  Neurological: She is alert and oriented to person, place, and time.  No ataxia on finger to nose bilaterally. No pronator drift. 5/5 strength throughout. CN 2-12 intact. Equal grip strength. Sensation intact. Gait is normal. Migrating paresthesias to right side of face.  Skin: Skin is warm and dry.  Psychiatric: She has a normal mood and affect. Her behavior is normal.    ED Course  Procedures (including critical care time) DIAGNOSTIC STUDIES: Oxygen Saturation is 97% on room air, adequate by my interpretation.    COORDINATION OF CARE: 12:21 AM- Patient informed of current plan for treatment and evaluation and agrees with plan at  this time.     Labs Reviewed  GLUCOSE, CAPILLARY - Abnormal; Notable for the following:    Glucose-Capillary 106 (*)    All other components within normal limits    Ct Head Wo Contrast  02/28/2013  *RADIOLOGY REPORT*  Clinical Data: Right facial numbness  CT HEAD WITHOUT CONTRAST  Technique:  Contiguous axial images were obtained from the base of the skull through the vertex without contrast.  Comparison: None.  Findings: No acute intracranial hemorrhage.  No focal mass lesion. No CT evidence of acute infarction.   No midline shift or mass effect.  No  hydrocephalus.  Basilar cisterns are patent.  Paranasal sinuses and mastoid air cells are clear.  Orbits are normal.  IMPRESSION: Normal head CT   Original Report Authenticated By: Genevive Bi, M.D.      No diagnosis found.    MDM  Migratory right facial numbness that started tonight. Improving. No focal weakness. No difficulty with speaking, walking or swallowing. Recent evaluation for anxiety related chest pain.  Normal neurological exam.  No motor or sensory deficits.  Recent chemistry normal.  CBG 106. Distribution of paresthesias not consistent with TIA or CVA. Suspect anxiety related.      I personally performed the services described in this documentation, which was scribed in my presence. The recorded information has been reviewed and is accurate.    Glynn Octave, MD 02/28/13 [phone]

## 2013-03-05 ENCOUNTER — Ambulatory Visit (INDEPENDENT_AMBULATORY_CARE_PROVIDER_SITE_OTHER): Payer: No Typology Code available for payment source | Admitting: Family Medicine

## 2013-03-05 VITALS — BP 132/89 | HR 92 | Temp 98.1°F | Resp 16 | Ht 65.0 in | Wt 128.8 lb

## 2013-03-05 DIAGNOSIS — D509 Iron deficiency anemia, unspecified: Secondary | ICD-10-CM

## 2013-03-05 DIAGNOSIS — F419 Anxiety disorder, unspecified: Secondary | ICD-10-CM

## 2013-03-05 DIAGNOSIS — F411 Generalized anxiety disorder: Secondary | ICD-10-CM

## 2013-03-05 NOTE — Patient Instructions (Signed)
Anxiety and Panic Attacks Your caregiver has informed you that you are having an anxiety or panic attack. There may be many forms of this. Most of the time these attacks come suddenly and without warning. They come at any time of day, including periods of sleep, and at any time of life. They may be strong and unexplained. Although panic attacks are very scary, they are physically harmless. Sometimes the cause of your anxiety is not known. Anxiety is a protective mechanism of the body in its fight or flight mechanism. Most of these perceived danger situations are actually nonphysical situations (such as anxiety over losing a job). CAUSES  The causes of an anxiety or panic attack are many. Panic attacks may occur in otherwise healthy people given a certain set of circumstances. There may be a genetic cause for panic attacks. Some medications may also have anxiety as a side effect. SYMPTOMS  Some of the most common feelings are:  Intense terror.  Dizziness, feeling faint.  Hot and cold flashes.  Fear of going crazy.  Feelings that nothing is real.  Sweating.  Shaking.  Chest pain or a fast heartbeat (palpitations).  Smothering, choking sensations.  Feelings of impending doom and that death is near.  Tingling of extremities, this may be from over-breathing.  Altered reality (derealization).  Being detached from yourself (depersonalization). Several symptoms can be present to make up anxiety or panic attacks. DIAGNOSIS  The evaluation by your caregiver will depend on the type of symptoms you are experiencing. The diagnosis of anxiety or panic attack is made when no physical illness can be determined to be a cause of the symptoms. TREATMENT  Treatment to prevent anxiety and panic attacks may include:  Avoidance of circumstances that cause anxiety.  Reassurance and relaxation.  Regular exercise.  Relaxation therapies, such as yoga.  Psychotherapy with a psychiatrist or  therapist.  Avoidance of caffeine, alcohol and illegal drugs.  Prescribed medication. SEEK IMMEDIATE MEDICAL CARE IF:   You experience panic attack symptoms that are different than your usual symptoms.  You have any worsening or concerning symptoms. Document Released: 10/12/2005 Document Revised: 01/04/2012 Document Reviewed: 02/13/2010 [facility] Patient Information 2013 [place], [place]. Iron Deficiency Anemia There are many types of anemia. Iron deficiency anemia is the most common. Iron deficiency anemia is a decrease in the number of red blood cells caused by too little iron. Without enough iron, your body does not produce enough hemoglobin. Hemoglobin is a substance in red blood cells that carries oxygen to the body's tissues. Iron deficiency anemia may leave you tired and short of breath. CAUSES   Lack of iron in the diet.  This may be seen in infants and children, because there is little iron in milk.  This may be seen in adults who do not eat enough iron-rich foods.  This may be seen in pregnant or breastfeeding women who do not take iron supplements. There is a much higher need for iron intake at these times.  Poor absorption of iron, as seen with intestinal disorders.  Intestinal bleeding.  Heavy periods. SYMPTOMS  Mild anemia may not be noticeable. Symptoms may include:  Fatigue.  Headache.  Pale skin.  Weakness.  Shortness of breath.  Dizziness.  Cold hands and feet.  Fast or irregular heartbeat. DIAGNOSIS  Diagnosis requires a thorough evaluation and physical exam by your caregiver.  Blood tests are generally used to confirm iron deficiency anemia.  Additional tests may be done to find the underlying cause of  your anemia. These may include:  Testing for blood in the stool (fecal occult blood test).  A procedure to see inside the colon and rectum (colonoscopy).  A procedure to see inside the esophagus and stomach (endoscopy). TREATMENT    Correcting the cause of the iron deficiency is the first step.  Medicines, such as oral contraceptives, can make heavy menstrual flows lighter.  Antibiotics and other medicines can be used to treat peptic ulcers.  Surgery may be needed to remove a bleeding polyp, tumor, or fibroid.  Often, iron supplements (ferrous sulfate) are taken.  For the best iron absorption, take these supplements with an empty stomach.  You may need to take the supplements with food if you cannot tolerate them on an empty stomach. Vitamin C improves the absorption of iron. Your caregiver may recommend taking your iron tablets with a glass of orange juice or vitamin C supplement.  Milk and antacids should not be taken at the same time as iron supplements. They may interfere with the absorption of iron.  Iron supplements can cause constipation. A stool softener is often recommended.  Pregnant and breastfeeding women will need to take extra iron, because their normal diet usually will not provide the required amount.  Patients who cannot tolerate iron by mouth can take it through a vein (intravenously) or by an injection into the muscle. HOME CARE INSTRUCTIONS   Ask your dietitian for help with diet questions.  Take iron and vitamins as directed by your caregiver.  Eat a diet rich in iron. Eat liver, lean beef, whole-grain bread, eggs, dried fruit, and dark green leafy vegetables. SEEK IMMEDIATE MEDICAL CARE IF:   You have a fainting episode. Do not drive yourself. Call your local emergency services (911 in [place] if no other help is available.  You have chest pain, nausea, or vomiting.  You develop severe or increased shortness of breath with activities.  You develop weakness or increased thirst.  You have a rapid heartbeat.  You develop unexplained sweating or become lightheaded when getting up from a chair or bed. MAKE SURE YOU:   Understand these instructions.  Will watch your  condition.  Will get help right away if you are not doing well or get worse. Document Released: 10/09/2000 Document Revised: 01/04/2012 Document Reviewed: 02/18/2010 [facility] Patient Information 2013 [place], [place].

## 2013-03-05 NOTE — Progress Notes (Signed)
31 yo woman with recurrent facial numbness and ache.  She was seen one week ago in ED with normal exam and CT, blood sugar.  They felt she had anxiety problem, since she was seen several times over past several weeks.  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   She has been with same company x 1 year.  Social:  Lives with sister and nephew.  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   Objective:  NAD, appropriate and cheerful HEENT:  Unremarkable Oroph:  Clear Neck:  Supple, no adenop or thyromegaly Chest:  Clear Heart:  Reg, no murmur or gallop Neuro:  CN  III-XII normal, motor:  Normal, reflexes normal  Results for orders placed during the hospital encounter of 02/27/13  GLUCOSE, CAPILLARY      Result Value Range   Glucose-Capillary 106 (*) 70 - 99 mg/dL   Labs:  Normal CMET, troponin      Abnormal CBC and ferritin  Assessment:  Anemia, iron deficiency and anxiety disorder  Plan:  Counseling in progress Start iron daily for two months.  Signed, Elvina Sidle, MD

## 2013-03-17 ENCOUNTER — Encounter (HOSPITAL_BASED_OUTPATIENT_CLINIC_OR_DEPARTMENT_OTHER): Payer: Self-pay | Admitting: *Deleted

## 2013-03-17 ENCOUNTER — Emergency Department (HOSPITAL_BASED_OUTPATIENT_CLINIC_OR_DEPARTMENT_OTHER)
Admission: EM | Admit: 2013-03-17 | Discharge: 2013-03-18 | Disposition: A | Discharge status: Home / Self Care | Payer: No Typology Code available for payment source | Attending: Emergency Medicine | Admitting: Emergency Medicine

## 2013-03-17 DIAGNOSIS — Z862 Personal history of diseases of the blood and blood-forming organs and certain disorders involving the immune mechanism: Secondary | ICD-10-CM | POA: Insufficient documentation

## 2013-03-17 DIAGNOSIS — K219 Gastro-esophageal reflux disease without esophagitis: Secondary | ICD-10-CM | POA: Insufficient documentation

## 2013-03-17 DIAGNOSIS — Z8659 Personal history of other mental and behavioral disorders: Secondary | ICD-10-CM | POA: Insufficient documentation

## 2013-03-17 DIAGNOSIS — Z3202 Encounter for pregnancy test, result negative: Secondary | ICD-10-CM | POA: Insufficient documentation

## 2013-03-17 LAB — URINE MICROSCOPIC-ADD ON

## 2013-03-17 LAB — URINALYSIS, ROUTINE W REFLEX MICROSCOPIC
Nitrite: NEGATIVE
Protein, ur: NEGATIVE mg/dL
Specific Gravity, Urine: 1.02 (ref 1.005–1.030)
Urobilinogen, UA: 0.2 mg/dL (ref 0.0–1.0)

## 2013-03-17 LAB — COMPREHENSIVE METABOLIC PANEL
AST: 15 U/L (ref 0–37)
Albumin: 4.5 g/dL (ref 3.5–5.2)
Alkaline Phosphatase: 69 U/L (ref 39–117)
Chloride: 103 mEq/L (ref 96–112)
Creatinine, Ser: 0.6 mg/dL (ref 0.50–1.10)
Potassium: 3.5 mEq/L (ref 3.5–5.1)
Total Bilirubin: 0.7 mg/dL (ref 0.3–1.2)
Total Protein: 8.5 g/dL — ABNORMAL HIGH (ref 6.0–8.3)

## 2013-03-17 LAB — CBC
MCHC: 35.5 g/dL (ref 30.0–36.0)
Platelets: 297 10*3/uL (ref 150–400)
RDW: 10.9 % — ABNORMAL LOW (ref 11.5–15.5)
WBC: 5.9 10*3/uL (ref 4.0–10.5)

## 2013-03-17 LAB — PREGNANCY, URINE: Preg Test, Ur: NEGATIVE

## 2013-03-17 MED ORDER — GI COCKTAIL ~~LOC~~
30.0000 mL | Freq: Once | ORAL | Status: AC
  Administered 2013-03-17: 30 mL via ORAL
  Filled 2013-03-17: qty 30

## 2013-03-17 NOTE — ED Notes (Signed)
Epigastric pain since 2 pm today. Used antiacid without relief.

## 2013-03-17 NOTE — ED Provider Notes (Signed)
History     CSN: [id number]  Arrival date & time 03/17/13  2100   First MD Initiated Contact with Patient 03/17/13 2301      Chief Complaint  Patient presents with  . Abdominal Pain    (Consider location/radiation/quality/duration/timing/severity/associated sxs/prior treatment) Patient is a 32 y.o. female presenting with abdominal pain. The history is provided by the patient. No language interpreter was used.  Abdominal Pain This is a new problem. The current episode started 12 to 24 hours ago. The problem occurs constantly. The problem has not changed since onset.Associated symptoms include abdominal pain. Pertinent negatives include no chest pain, no headaches and no shortness of breath. Nothing aggravates the symptoms. Nothing relieves the symptoms. She has tried nothing for the symptoms. The treatment provided no relief.  After eating greens and rice and chicken  Past Medical History  Diagnosis Date  . Allergy   . Anxiety   . Anemia     History reviewed. No pertinent past surgical history.  Family History  Problem Relation Age of Onset  . Hypertension Father   . Diabetes Maternal Grandmother   . Cancer Maternal Grandfather     History  Substance Use Topics  . Smoking status: Never Smoker   . Smokeless tobacco: Not on file  . Alcohol Use: No    OB History   Grav Para Term Preterm Abortions TAB SAB Ect Mult Living                  Review of Systems  Constitutional: Negative for fever.  HENT: Negative for neck pain.   Respiratory: Negative for shortness of breath.   Cardiovascular: Negative for chest pain, palpitations and leg swelling.  Gastrointestinal: Positive for abdominal pain.  Neurological: Negative for headaches.  All other systems reviewed and are negative.    Allergies  Review of patient's allergies indicates no known allergies.  Home Medications   Current Outpatient Rx  Name  Route  Sig  Dispense  Refill  . Multiple Vitamin  (MULTIVITAMIN) tablet   Oral   Take 1 tablet by mouth daily.           BP 142/90  Pulse 97  Temp(Src) 98.3 F (36.8 C) (Oral)  Resp 16  SpO2 98%  LMP 02/15/2013  Physical Exam  Constitutional: She is oriented to person, place, and time. She appears well-developed and well-nourished. No distress.  HENT:  Head: Normocephalic and atraumatic.  Eyes: Conjunctivae are normal. Pupils are equal, round, and reactive to light.  Neck: Normal range of motion. Neck supple.  Cardiovascular: Normal rate, regular rhythm and intact distal pulses.   Pulmonary/Chest: Effort normal and breath sounds normal. She has no wheezes. She has no rales. She exhibits no tenderness.  Abdominal: Soft. She exhibits no distension. Bowel sounds are increased. There is no tenderness. There is no rebound and no guarding.    Musculoskeletal: Normal range of motion.  Neurological: She is alert and oriented to person, place, and time.  Skin: Skin is warm and dry.  Psychiatric: She has a normal mood and affect.    ED Course  Procedures (including critical care time)  Labs Reviewed  URINALYSIS, ROUTINE W REFLEX MICROSCOPIC - Abnormal; Notable for the following:    Hgb urine dipstick LARGE (*)    Ketones, ur 15 (*)    Leukocytes, UA TRACE (*)    All other components within normal limits  CBC - Abnormal; Notable for the following:    RDW 10.9 (*)  All other components within normal limits  COMPREHENSIVE METABOLIC PANEL - Abnormal; Notable for the following:    Calcium 11.8 (*)    Total Protein 8.5 (*)    All other components within normal limits  URINE MICROSCOPIC-ADD ON - Abnormal; Notable for the following:    Squamous Epithelial / LPF FEW (*)    Bacteria, UA FEW (*)    All other components within normal limits  PREGNANCY, URINE  LIPASE, BLOOD   No results found.   No diagnosis found.    MDM  Sx consistent with gerd will treat for same.  Follow up with your family doctor.  Return for  worsening symptoms        Zaire Levesque K Minka Knight-Rasch, MD 03/18/13 1610

## 2013-03-17 NOTE — ED Notes (Signed)
MD at bedside. 

## 2013-03-17 NOTE — ED Notes (Signed)
C/o epigastric and upper chest pain since 1400 today.  States she's had 2 similar episodes earlier this week, but today's pain lasted longer.  Pain radiates to her back and primarily her right arm, but today radiated down left arm.  Denies SHOB or n/v.  Mild diaphoresis with pain. Has been seen at [facility] ED for same, dx with anxiety.  States she is under a lot of work related and financial stress.  Currently attending counseling and trying to exercise to manage stress.  No anxiety meds at present.

## 2013-03-17 NOTE — ED Notes (Signed)
Report to Sue,RN.

## 2013-03-18 MED ORDER — OMEPRAZOLE 20 MG PO CPDR: 20.0000 mg | DELAYED_RELEASE_CAPSULE | Freq: Every day | ORAL | Status: DC

## 2013-03-18 MED ORDER — SUCRALFATE 1 GM/10ML PO SUSP: 1.0000 g | Freq: Four times a day (QID) | ORAL | Status: DC

## 2013-03-27 ENCOUNTER — Ambulatory Visit (INDEPENDENT_AMBULATORY_CARE_PROVIDER_SITE_OTHER): Payer: No Typology Code available for payment source | Admitting: Physician Assistant

## 2013-03-27 VITALS — BP 122/82 | HR 106 | Temp 97.4°F | Resp 18 | Ht 66.0 in | Wt 119.0 lb

## 2013-03-27 DIAGNOSIS — K219 Gastro-esophageal reflux disease without esophagitis: Secondary | ICD-10-CM

## 2013-03-27 DIAGNOSIS — F411 Generalized anxiety disorder: Secondary | ICD-10-CM

## 2013-03-27 DIAGNOSIS — R0602 Shortness of breath: Secondary | ICD-10-CM

## 2013-03-27 NOTE — Progress Notes (Signed)
  Subjective:    Patient ID: Jodi Bishop, female    DOB: Nov 22, 1980, 32 y.o.   MRN: [id number]  HPI Jodi Bishop comes in tonight after having an episode of SOB earlier in the day when she was getting ready to go to the store. She denies associated CP, radiating pain, cough, N/V, belching, diaphoresis.  She states that her symptoms have now resolved and feels well. Over the last 2 months, Jodi Bishop has presented to the ED 4 times with episodes of CP that have been diagnosed as anxiety and GERD.  Her last visit was 03/17/13.  At last visit, CMET showed elevated Calcium.  Her last TSH was 01/2012 and was normal.   Jodi Bishop has made dietary changes to alleviate GERD symptoms and they have helped. She states, however, that she has lost 8 lbs since April.  She does note that she has belching, occasional RUQ and R chest discomfort at times but does not associate these symptom with eating. She had normal liver enzymes.  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 She has felt that her anxiety improved a great deal.    She does not smoke, consume ETOH or caffeine.   She is not on OCP She has not had any prolonged episodes of immobilization like long travel, illness or surgeries recently.  Jodi Bishop is in good health otherwise.   Review of Systems As above     Objective:   Physical Exam  Constitutional: She is oriented to person, place, and time. She appears well-developed and well-nourished.  HENT:  Head: Normocephalic.  Mouth/Throat: Oropharynx is clear and moist.  Eyes: Pupils are equal, round, and reactive to light.  Neck: No mass and no thyromegaly present.  Cardiovascular: Normal rate, regular rhythm and normal pulses.   Pulmonary/Chest: Effort normal and breath sounds normal.  Neurological: She is alert and oriented to person, place, and time.  Skin: Skin is warm and dry.  Psychiatric: She has a normal mood and affect. Her  behavior is normal. Her mood appears not anxious.   EKG NSR Overread by Dr. Nilda Bishop      Assessment & Plan:  Shortness of Breath Hypercalcemia GERD Anxiety  TSH, PTH and CA Reassure that previous labs, imaging and EKGs support noncardiac and non pulmonary causes Continue lifestyle changes Consider GB US if symptoms persist Discussed with Dr. Katrinka Bishop.  Jodi Bishop will follow up with Dr. Katrinka Bishop if needed.

## 2013-03-28 LAB — TSH: TSH: 0.627 u[IU]/mL (ref 0.350–4.500)

## 2013-05-11 ENCOUNTER — Ambulatory Visit: Payer: No Typology Code available for payment source

## 2013-05-11 ENCOUNTER — Encounter: Payer: Self-pay | Admitting: Family Medicine

## 2013-05-27 ENCOUNTER — Ambulatory Visit (INDEPENDENT_AMBULATORY_CARE_PROVIDER_SITE_OTHER): Payer: No Typology Code available for payment source | Admitting: Family Medicine

## 2013-05-27 ENCOUNTER — Ambulatory Visit: Payer: No Typology Code available for payment source

## 2013-05-27 VITALS — BP 134/80 | HR 82 | Temp 98.5°F | Resp 16 | Ht 66.5 in | Wt 116.0 lb

## 2013-05-27 DIAGNOSIS — R109 Unspecified abdominal pain: Secondary | ICD-10-CM

## 2013-05-27 DIAGNOSIS — K921 Melena: Secondary | ICD-10-CM

## 2013-05-27 LAB — IFOBT (OCCULT BLOOD): IFOBT: NEGATIVE

## 2013-05-27 NOTE — Progress Notes (Signed)
Subjective:    Patient ID: Jodi Bishop, female    DOB: August 30, 1981, 32 y.o.   MRN: [id number]  HPI 32 y.o. Female presents to clinic today for abdominal pain and blood in stool. Patient noticed blood in her stool this morning. She states that the blood is in the stool not on the outside of the stool, it is bright red, none on the toilet paper when she wipes and not in the toilet bowel. She has not had any diarrhea or constipation. Patient describes her abdominal pain in her upper abdomen and more so on the left flank. She says it is a burning pain that lasts for several minutes up to a couple hours. She has not taken anything for the abdominal pain.  Pt has been having this abdominal pain on and off for several months. She has been seen in the ED and by her PCP for full evaluation. She has been recently diagnosed with GERD. She has been trying to change her diet and exercise recently to help with symptoms because she does not like taking medications. She attributes some of her abdominal pain to increased exercise and core workouts.   Review of Systems  Constitutional: Negative for fever, chills, activity change and fatigue.  Eyes: Negative for visual disturbance.  Respiratory: Negative for chest tightness and shortness of breath.   Cardiovascular: Negative for chest pain, palpitations and leg swelling.  Gastrointestinal: Positive for abdominal pain and blood in stool. Negative for nausea, vomiting, diarrhea, constipation, abdominal distention, anal bleeding and rectal pain.  Genitourinary: Positive for flank pain. Negative for dysuria, hematuria, difficulty urinating, vaginal pain and menstrual problem.  Musculoskeletal: Positive for back pain. Negative for myalgias and arthralgias.  Skin: Negative for pallor and rash.  Neurological: Negative for light-headedness and headaches.  Psychiatric/Behavioral: The patient is nervous/anxious.   All other systems reviewed and are negative.        Objective:   Physical Exam  Nursing note and vitals reviewed. Constitutional: She is oriented to person, place, and time. Vital signs are normal. She appears well-developed and well-nourished. She appears distressed.  HENT:  Head: Normocephalic and atraumatic.  Right Ear: External ear normal.  Left Ear: External ear normal.  Nose: Nose normal.  Mouth/Throat: Oropharynx is clear and moist.  Eyes: Conjunctivae are normal.  Neck: Normal range of motion. Neck supple. No thyromegaly present.  Cardiovascular: Normal rate, regular rhythm and normal heart sounds.   Pulmonary/Chest: Effort normal and breath sounds normal.  Abdominal: Soft. Normal appearance and bowel sounds are normal. She exhibits no distension and no mass. There is no hepatosplenomegaly. There is tenderness in the right lower quadrant and left lower quadrant. There is no rebound and no guarding.  Lymphadenopathy:    She has no cervical adenopathy.  Neurological: She is alert and oriented to person, place, and time. She has normal strength.  Skin: Skin is warm and dry. She is not diaphoretic.  Psychiatric: She has a normal mood and affect. Her speech is normal and behavior is normal. Judgment and thought content normal. Cognition and memory are normal.    Results for orders placed in visit on 05/27/13  IFOBT (OCCULT BLOOD)      Result Value Range   IFOBT Negative      Abd Acute W/Chest primary read by Dr. Katrinka Blazing. No masses or air fluid levels. Lots of stool apparent.     Assessment & Plan:  Abdominal pain - Plan: DG Abd Acute W/Chest  Blood in stool -  Plan: IFOBT POC (occult bld, rslt in office). No blood was found on IFOBT  Patient is constipated which may be cause of her blood in stool. Instructed patient to drink plenty of fluids and eat foods high in dietary fiber. Told her she may try stool softeners and/or Miralax to help with constipation. If she continue to have abdominal pain and discomfort she can follow up with  your Primary Care provider or contact us

## 2013-05-27 NOTE — Progress Notes (Signed)
I have reviewed history and physical exam in detail.  Xray abdomen reviewed and negative.  Agree with current assessment and plan.

## 2013-05-27 NOTE — Progress Notes (Signed)
I have examined this patient along with the student and agree.  

## 2013-05-27 NOTE — Patient Instructions (Addendum)
Drink plenty of fluids and eat foods high in dietary fiber You can take stool softeners and Miralax to help with constipation If you continue to have abdominal pain and discomfort you can follow up with your Primary Care provider

## 2013-07-03 IMAGING — CR DG CHEST 2V
2 series · 2 of 2 positions shown · non-contrast
Comparison: None

CLINICAL DATA: Chest pain.

CHEST - 2 VIEW

[w chest pa]
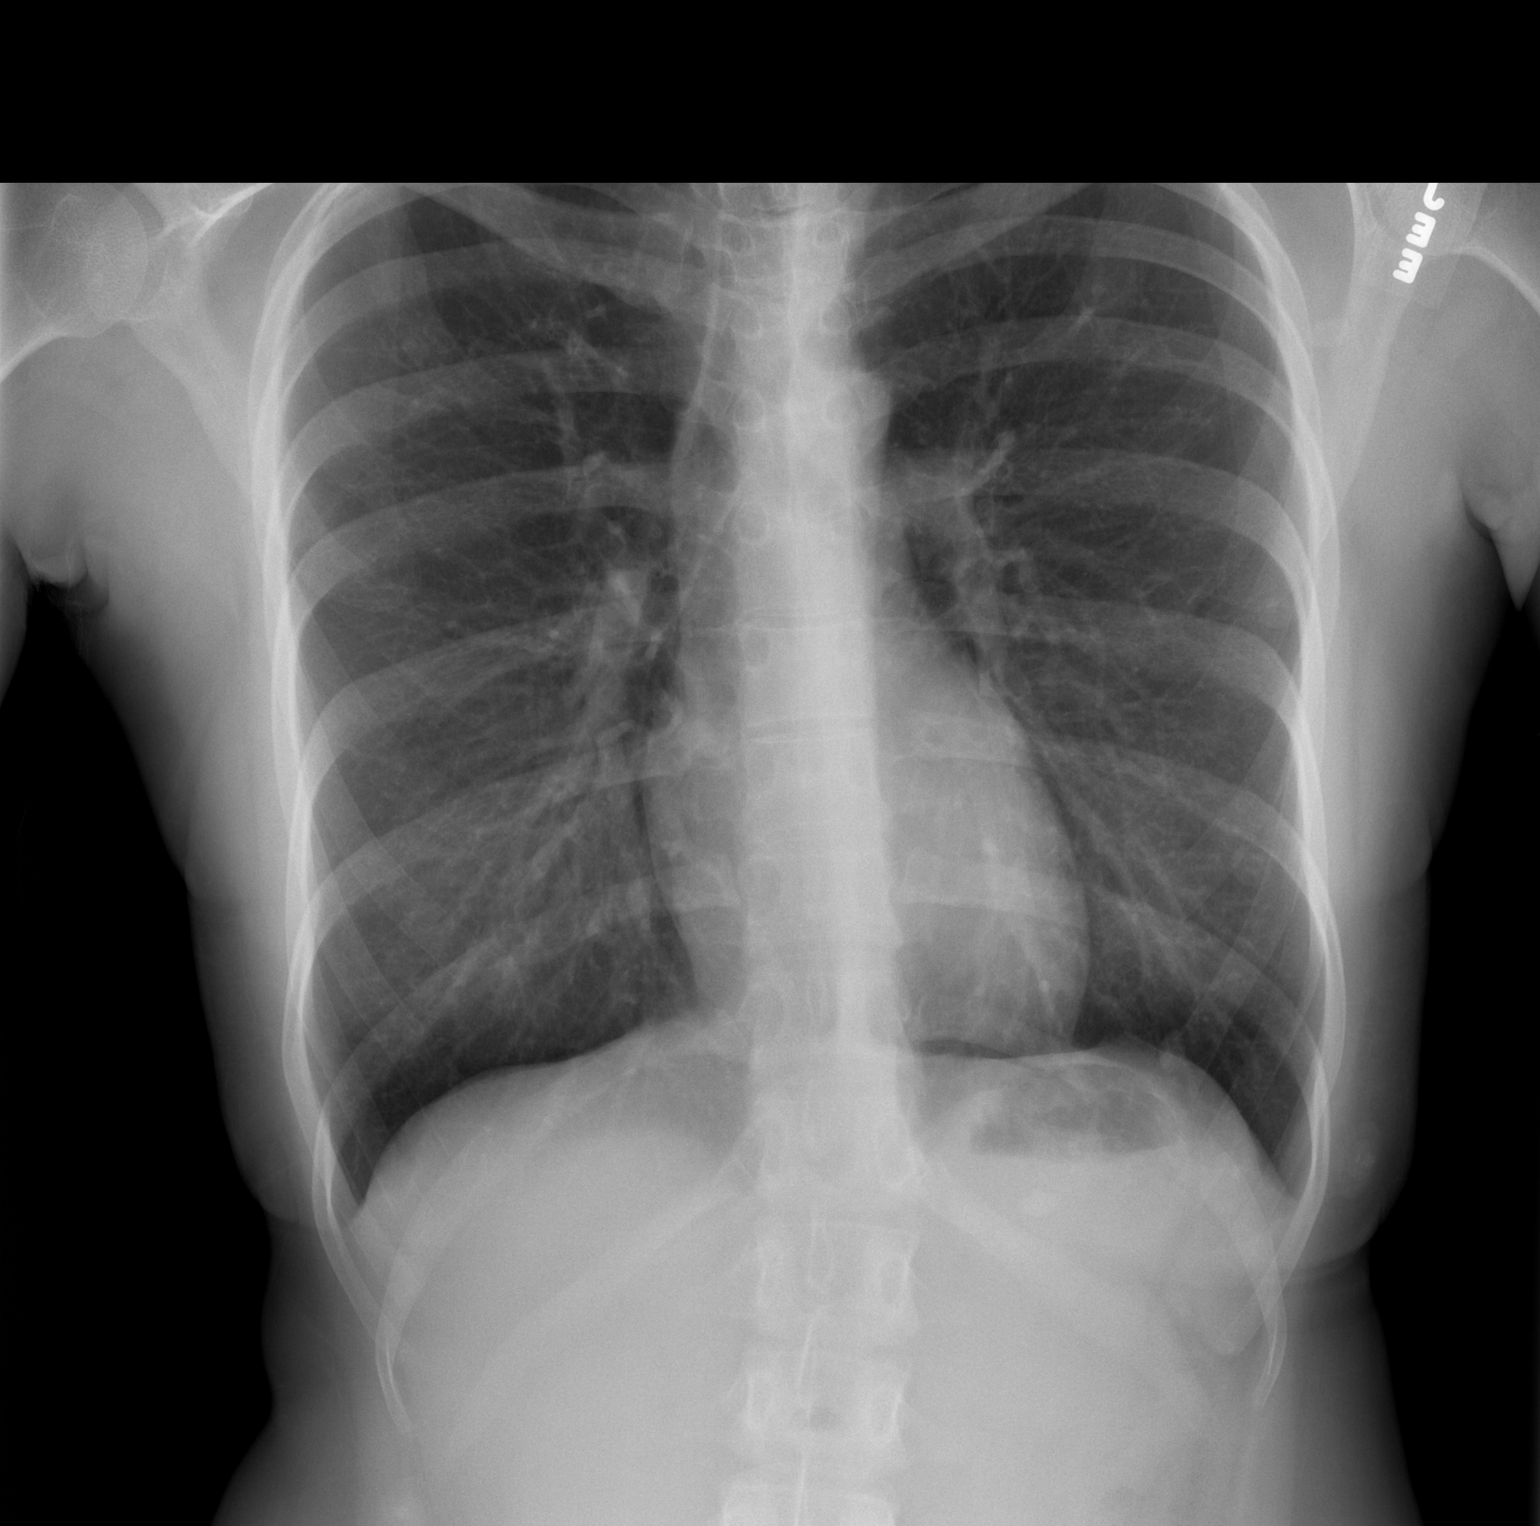

[w chest lat]
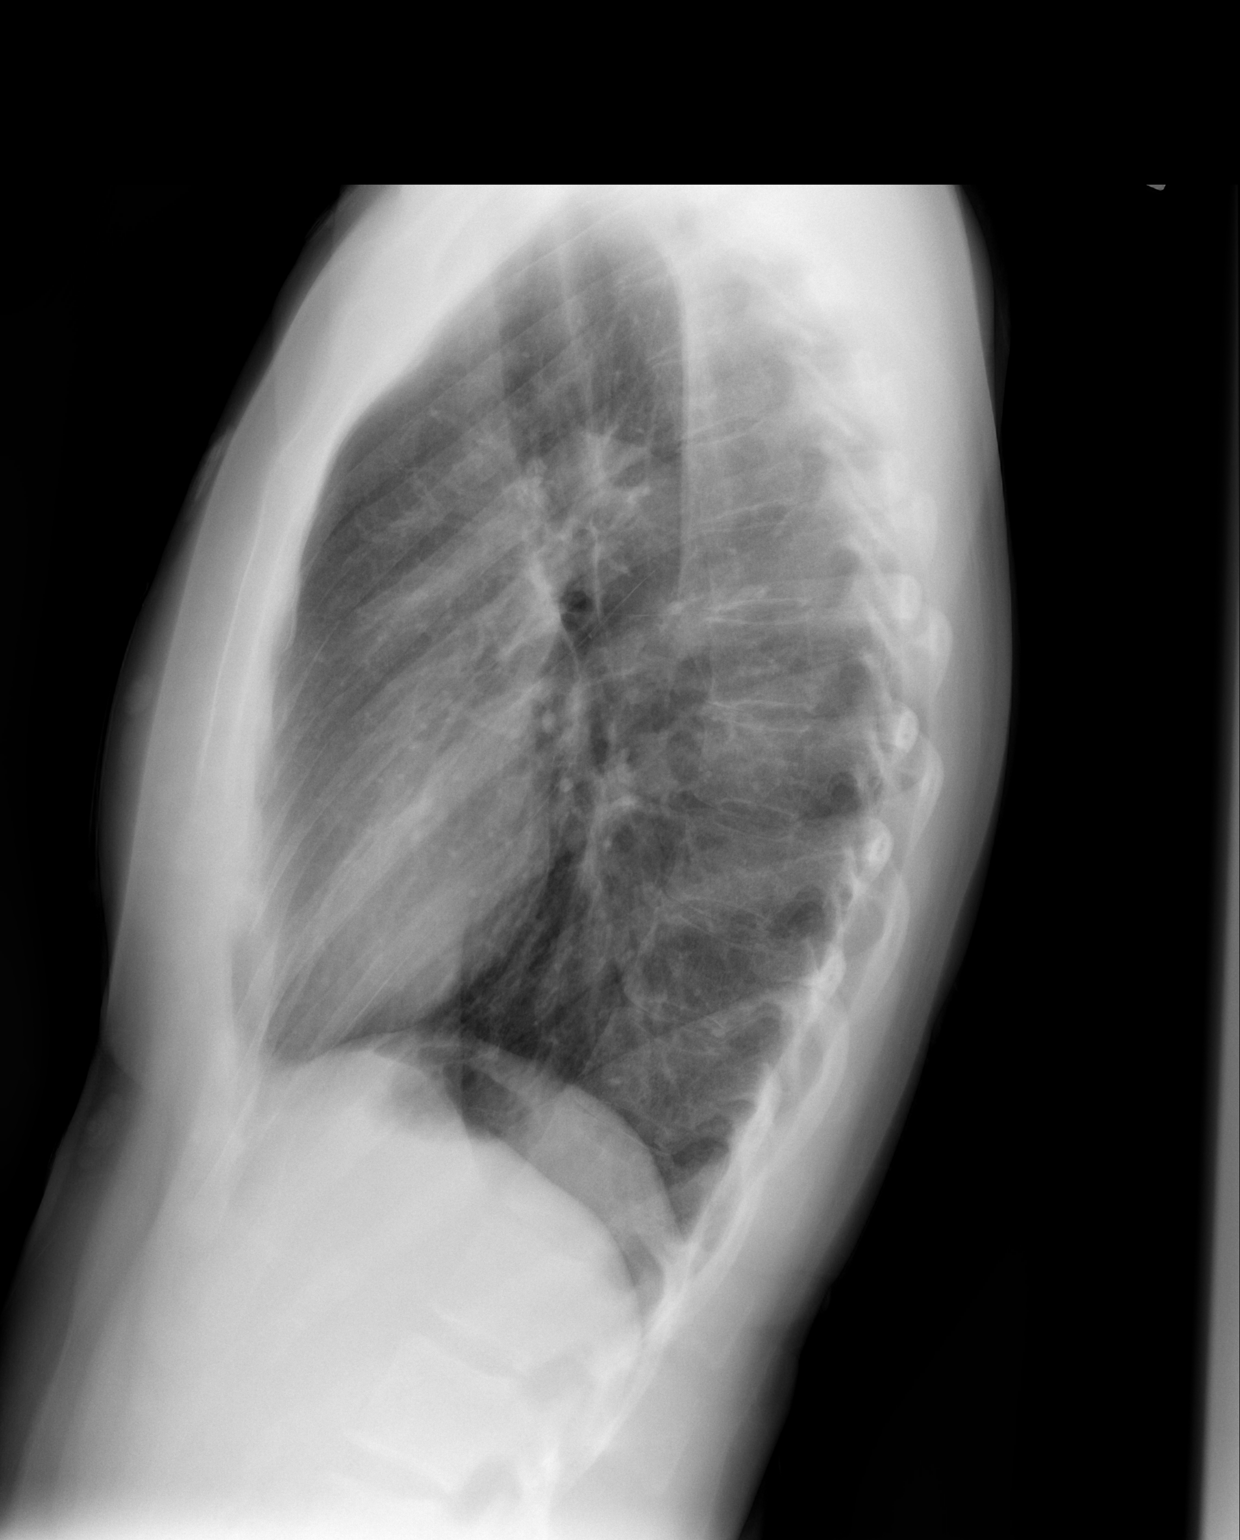

[2 of 2 positions shown; findings below may reference images not displayed]

FINDINGS: Heart and mediastinal contours are within normal limits.
No focal opacities or effusions.  No acute bony abnormality.
IMPRESSION: No active cardiopulmonary disease.

## 2013-08-18 ENCOUNTER — Emergency Department (HOSPITAL_BASED_OUTPATIENT_CLINIC_OR_DEPARTMENT_OTHER)
Admission: EM | Admit: 2013-08-18 | Discharge: 2013-08-18 | Disposition: A | Discharge status: Home / Self Care | Payer: No Typology Code available for payment source | Attending: Emergency Medicine | Admitting: Emergency Medicine

## 2013-08-18 ENCOUNTER — Encounter (HOSPITAL_BASED_OUTPATIENT_CLINIC_OR_DEPARTMENT_OTHER): Payer: Self-pay | Admitting: Emergency Medicine

## 2013-08-18 DIAGNOSIS — K219 Gastro-esophageal reflux disease without esophagitis: Secondary | ICD-10-CM | POA: Insufficient documentation

## 2013-08-18 DIAGNOSIS — Z862 Personal history of diseases of the blood and blood-forming organs and certain disorders involving the immune mechanism: Secondary | ICD-10-CM | POA: Insufficient documentation

## 2013-08-18 DIAGNOSIS — Z8659 Personal history of other mental and behavioral disorders: Secondary | ICD-10-CM | POA: Insufficient documentation

## 2013-08-18 DIAGNOSIS — M62838 Other muscle spasm: Secondary | ICD-10-CM | POA: Insufficient documentation

## 2013-08-18 MED ORDER — DIAZEPAM 5 MG PO TABS: 5.0000 mg | ORAL_TABLET | Freq: Three times a day (TID) | ORAL | Status: DC | PRN

## 2013-08-18 NOTE — ED Provider Notes (Signed)
CSN: [id number]     Arrival date & time 08/18/13  0013 History   First MD Initiated Contact with Patient 08/18/13 0101     Chief Complaint  Patient presents with  . Muscle Pain   (Consider location/radiation/quality/duration/timing/severity/associated sxs/prior Treatment) HPI This is a 32 year old female who rolled over in bed couple of hours ago and felt a sudden spasm in her left lateral chest. She is now having a painful spasm in that area radiating to her back. The pain is moderate worse with movement or palpation. There is no associated numbness or weakness. There is no shortness of breath.  Past Medical History  Diagnosis Date  . Allergy   . Anxiety   . Anemia   . GERD (gastroesophageal reflux disease)    History reviewed. No pertinent past surgical history. Family History  Problem Relation Age of Onset  . Hypertension Father   . Hyperlipidemia Father   . Diabetes Maternal Grandmother   . Cancer Maternal Grandfather    History  Substance Use Topics  . Smoking status: Never Smoker   . Smokeless tobacco: Never Used  . Alcohol Use: No   OB History   Grav Para Term Preterm Abortions TAB SAB Ect Mult Living                 Review of Systems  All other systems reviewed and are negative.    Allergies  Review of patient's allergies indicates no known allergies.  Home Medications   Current Outpatient Rx  Name  Route  Sig  Dispense  Refill  . Multiple Vitamins-Minerals (MULTIVITAMIN PO)      Med Name: [place] RAW 1 multivitamin for women One PO Daily         . omeprazole (PRILOSEC) 20 MG capsule   Oral   Take 1 capsule (20 mg total) by mouth daily.   30 capsule   0   . sucralfate (CARAFATE) 1 GM/10ML suspension   Oral   Take 10 mLs (1 g total) by mouth 4 (four) times daily.   420 mL   0    BP 140/81  Pulse 74  Temp(Src) 97.7 F (36.5 C) (Oral)  Resp 18  Ht 5\' 6"  (1.676 m)  Wt 115 lb (52.164 kg)  BMI 18.57 kg/m2  SpO2 100%  LMP  08/11/2013  Physical Exam General: Well-developed, well-nourished female in no acute distress; appearance consistent with age of record HENT: normocephalic; atraumatic Eyes: pupils equal, round and reactive to light; extraocular muscles intact Neck: supple Heart: regular rate and rhythm Lungs: clear to auscultation bilaterally Chest: Muscle spasm left upper chest about the posterior axillary line Abdomen: soft; nondistended; nontender Extremities: No deformity; full range of motion Neurologic: Awake, alert and oriented; motor function intact in all extremities and symmetric; no facial droop Skin: Warm and dry Psychiatric: Normal mood and affect    ED Course  Procedures (including critical care time)  MDM      Hanley Seamen, MD 08/18/13 0111

## 2013-08-18 NOTE — ED Notes (Signed)
Pt reports she was stretching and felt a pull on the side of her left breast- pain now radiating to back

## 2013-08-31 ENCOUNTER — Other Ambulatory Visit: Payer: Self-pay

## 2014-05-31 ENCOUNTER — Ambulatory Visit (INDEPENDENT_AMBULATORY_CARE_PROVIDER_SITE_OTHER): Payer: No Typology Code available for payment source | Admitting: Emergency Medicine

## 2014-05-31 VITALS — BP 112/68 | HR 100 | Temp 98.5°F | Resp 16 | Ht 66.0 in | Wt 119.0 lb

## 2014-05-31 DIAGNOSIS — J018 Other acute sinusitis: Secondary | ICD-10-CM

## 2014-05-31 MED ORDER — AMOXICILLIN-POT CLAVULANATE 875-125 MG PO TABS: 1.0000 | ORAL_TABLET | Freq: Two times a day (BID) | ORAL | Status: DC

## 2014-05-31 MED ORDER — PSEUDOEPHEDRINE-GUAIFENESIN ER 60-600 MG PO TB12: 1.0000 | ORAL_TABLET | Freq: Two times a day (BID) | ORAL | Status: AC

## 2014-05-31 NOTE — Progress Notes (Signed)
[facility] [address], [place] [address] [phone]  Date:  05/31/2014   Name:  Jodi Bishop   DOB:  1981/03/27   MRN:  [id number]  PCP:  Helane Rima, MD    Chief Complaint: Cough, Facial Pain and Headache   History of Present Illness:  Jodi Bishop is a 33 y.o. very pleasant female patient who presents with the following:  Ill for a week with nasal congestion and post nasal drip.  Has mucopurulent nasal discharge.  No fever or chills.  Pressure on cheeks.  Non productive cough.  No wheezing or shortness of breath.  No nausea or vomiting.  No stool change or rash.  No improvement with over the counter medications or other home remedies.  Denies other complaint or health concern today.   There are no active problems to display for this patient.   Past Medical History  Diagnosis Date  . Allergy   . Anxiety   . Anemia   . GERD (gastroesophageal reflux disease)     History reviewed. No pertinent past surgical history.  History  Substance Use Topics  . Smoking status: Never Smoker   . Smokeless tobacco: Never Used  . Alcohol Use: No    Family History  Problem Relation Age of Onset  . Hypertension Father   . Hyperlipidemia Father   . Diabetes Maternal Grandmother   . Cancer Maternal Grandfather     No Known Allergies  Medication list has been reviewed and updated.  Current Outpatient Prescriptions on File Prior to Visit  Medication Sig Dispense Refill  . diazepam (VALIUM) 5 MG tablet Take 1 tablet (5 mg total) by mouth every 8 (eight) hours as needed (muscle spasm).  15 tablet  0  . Multiple Vitamins-Minerals (MULTIVITAMIN PO) Med Name: [place] 1 multivitamin for women One PO Daily      . omeprazole (PRILOSEC) 20 MG capsule Take 1 capsule (20 mg total) by mouth daily.  30 capsule  0  . sucralfate (CARAFATE) 1 GM/10ML suspension Take 10 mLs (1 g total) by mouth 4 (four) times daily.  420 mL  0   No current  facility-administered medications on file prior to visit.    Review of Systems:  As per HPI, otherwise negative.    Physical Examination: Filed Vitals:   05/31/14 1120  BP: 112/68  Pulse: 100  Temp: 98.5 F (36.9 C)  Resp: 16   Filed Vitals:   05/31/14 1120  Height: 5\' 6"  (1.676 m)  Weight: 119 lb (53.978 kg)   Body mass index is 19.22 kg/(m^2). Ideal Body Weight: Weight in (lb) to have BMI = 25: 154.6  GEN: WDWN, NAD, Non-toxic, A & O x 3 HEENT: Atraumatic, Normocephalic. Neck supple. No masses, No LAD. Ears and Nose: No external deformity.  Purulent nasal drainage CV: RRR, No M/G/R. No JVD. No thrill. No extra heart sounds. PULM: CTA B, no wheezes, crackles, rhonchi. No retractions. No resp. distress. No accessory muscle use. ABD: S, NT, ND, +BS. No rebound. No HSM. EXTR: No c/c/e NEURO Normal gait.  PSYCH: Normally interactive. Conversant. Not depressed or anxious appearing.  Calm demeanor.    Assessment and Plan: Sinusitis augmentin mucinex d  Signed,  Ellison Carwin, MD

## 2014-05-31 NOTE — Patient Instructions (Signed)

## 2017-04-29 ENCOUNTER — Encounter: Payer: No Typology Code available for payment source | Admitting: Obstetrics & Gynecology

## 2017-05-03 ENCOUNTER — Encounter: Payer: Self-pay | Admitting: Obstetrics & Gynecology

## 2017-05-03 ENCOUNTER — Ambulatory Visit (INDEPENDENT_AMBULATORY_CARE_PROVIDER_SITE_OTHER): Payer: 59 | Admitting: Obstetrics & Gynecology

## 2017-05-03 ENCOUNTER — Other Ambulatory Visit: Payer: Self-pay | Admitting: Obstetrics & Gynecology

## 2017-05-03 VITALS — BP 124/80 | Ht 66.0 in | Wt 136.0 lb

## 2017-05-03 DIAGNOSIS — Z01419 Encounter for gynecological examination (general) (routine) without abnormal findings: Secondary | ICD-10-CM

## 2017-05-03 DIAGNOSIS — Z3041 Encounter for surveillance of contraceptive pills: Secondary | ICD-10-CM

## 2017-05-03 DIAGNOSIS — Z113 Encounter for screening for infections with a predominantly sexual mode of transmission: Secondary | ICD-10-CM

## 2017-05-03 DIAGNOSIS — Z1151 Encounter for screening for human papillomavirus (HPV): Secondary | ICD-10-CM | POA: Diagnosis not present

## 2017-05-03 MED ORDER — NORETHIN ACE-ETH ESTRAD-FE 1-20 MG-MCG PO TABS: 1.0000 | ORAL_TABLET | Freq: Every day | ORAL | 4 refills | Status: DC

## 2017-05-03 NOTE — Progress Notes (Signed)
AMANII SNETHEN May 28, 1981 [id number]   History:    36 y.o. G0  Moved to [place]  RP:  Established patient presenting for annual gyn exam  HPI:  Well on Junel Fe 1/20, but moderate PMS still.  Would like to discuss alternative, longer term contraception.  No pelvic pain.  Normal vaginal secretions.  Breasts wnl.  Mictions/BMs wnl.  Past medical history,surgical history, family history and social history were all reviewed and documented in the EPIC chart.  Gynecologic History Patient's last menstrual period was 04/09/2017. Contraception: OCP (estrogen/progesterone) Last Pap: 2017. Results were: normal Last mammogram: Never.   Obstetric History OB History  Gravida Para Term Preterm AB Living  0 0 0 0 0 0  SAB TAB Ectopic Multiple Live Births  0 0 0 0 0         ROS: A ROS was performed and pertinent positives and negatives are included in the history.  GENERAL: No fevers or chills. HEENT: No change in vision, no earache, sore throat or sinus congestion. NECK: No pain or stiffness. CARDIOVASCULAR: No chest pain or pressure. No palpitations. PULMONARY: No shortness of breath, cough or wheeze. GASTROINTESTINAL: No abdominal pain, nausea, vomiting or diarrhea, melena or bright red blood per rectum. GENITOURINARY: No urinary frequency, urgency, hesitancy or dysuria. MUSCULOSKELETAL: No joint or muscle pain, no back pain, no recent trauma. DERMATOLOGIC: No rash, no itching, no lesions. ENDOCRINE: No polyuria, polydipsia, no heat or cold intolerance. No recent change in weight. HEMATOLOGICAL: No anemia or easy bruising or bleeding. NEUROLOGIC: No headache, seizures, numbness, tingling or weakness. PSYCHIATRIC: No depression, no loss of interest in normal activity or change in sleep pattern.     Exam:   BP 124/80   Ht 5\' 6"  (1.676 m)   Wt 61.7 kg (136 lb)   LMP 04/09/2017 Comment: pill  BMI 21.95 kg/m   Body mass index is 21.95 kg/m.  General appearance : Well developed well  nourished female. No acute distress HEENT: Eyes: no retinal hemorrhage or exudates,  Neck supple, trachea midline, no carotid bruits, no thyroidmegaly Lungs: Clear to auscultation, no rhonchi or wheezes, or rib retractions  Heart: Regular rate and rhythm, no murmurs or gallops Breast:Examined in sitting and supine position were symmetrical in appearance, no palpable masses or tenderness,  no skin retraction, no nipple inversion, no nipple discharge, no skin discoloration, no axillary or supraclavicular lymphadenopathy Abdomen: no palpable masses or tenderness, no rebound or guarding Extremities: no edema or skin discoloration or tenderness  Pelvic:  Bartholin, Urethra, Skene Glands: Within normal limits             Vagina: No gross lesions or discharge  Cervix: No gross lesions or discharge.  Pap reflex/Gono-Chlam done.  Uterus  AV, normal size, shape and consistency, non-tender and mobile  Adnexa  Without masses or tenderness  Anus and perineum  normal     Assessment/Plan:  36 y.o. female for annual exam   1. Encounter for routine gynecological examination with Papanicolaou smear of cervix Normal gyn exam.  Pap reflex done.  Breasts wnl.  2. Screen for STD (sexually transmitted disease) Gono-Chlam on Pap.  Condoms recommended. - HIV antibody - RPR - Hepatitis C Antibody - Hepatitis B Surface AntiGEN  3. Encounter for surveillance of contraceptive pills Currently on Junel Fe 1/20.  Represcribed.  Would like a long term contraceptive method which would also help with PMS.  DepoProvera and Nexplanon discussed, risks/benefits/side effects reviewed.  Nuvaring and contraceptive patch also  reviewed.  Copper/Progestin IUDs not recommended given that they would be of no or minimal help with PMS.  Patient will call back if decides to proceed.  Counseling on above issues >50% x 10 minutes.  Princess Bruins MD, 12:06 PM 05/03/2017

## 2017-05-04 LAB — HIV ANTIBODY (ROUTINE TESTING W REFLEX): HIV: NONREACTIVE

## 2017-05-04 LAB — RPR

## 2017-05-04 LAB — HEPATITIS B SURFACE ANTIGEN: Hepatitis B Surface Ag: NEGATIVE

## 2017-05-04 LAB — HEPATITIS C ANTIBODY: HCV Ab: NEGATIVE

## 2017-05-05 LAB — PAP IG, CT-NG NAA, HPV HIGH-RISK
Chlamydia Probe Amp: NOT DETECTED
GC Probe Amp: NOT DETECTED
HPV DNA HIGH RISK: DETECTED — AB

## 2017-05-08 ENCOUNTER — Telehealth: Payer: Self-pay | Admitting: Obstetrics & Gynecology

## 2017-05-08 NOTE — Patient Instructions (Signed)
1. Encounter for routine gynecological examination with Papanicolaou smear of cervix Normal gyn exam.  Pap reflex done.  Breasts wnl.  2. Screen for STD (sexually transmitted disease) Gono-Chlam on Pap.  Condoms recommended. - HIV antibody - RPR - Hepatitis C Antibody - Hepatitis B Surface AntiGEN  3. Encounter for surveillance of contraceptive pills Currently on Junel Fe 1/20.  Represcribed.  Would like a long term contraceptive method which would also help with PMS.  DepoProvera and Nexplanon discussed, risks/benefits/side effects reviewed.  Nuvaring and contraceptive patch also reviewed.  Copper/Progestin IUDs not recommended given that they would be of no or minimal help with PMS.  Patient will call back if decides to proceed.  Jodi Bishop, it was a pleasure to see you today!

## 2017-05-08 NOTE — Telephone Encounter (Signed)
Error

## 2017-05-10 ENCOUNTER — Telehealth: Payer: Self-pay | Admitting: *Deleted

## 2017-05-10 LAB — HPV TYPE 16 AND 18/45 RNA
HPV TYPE 16 RNA: NOT DETECTED
HPV TYPE 18/45 RNA: NOT DETECTED

## 2017-05-10 NOTE — Telephone Encounter (Signed)
(  pt aware you are of the office) Pt was seen on OV 05/03/17 and wrong dose of Junel Fe was sent to pharmacy, pt said she takes Junel FE 1.5/30 mcg, okay to switch to 1.5/30 mcg dose? Please advise

## 2017-05-18 MED ORDER — NORETHIN ACE-ETH ESTRAD-FE 1.5-30 MG-MCG PO TABS
1.0000 | ORAL_TABLET | Freq: Every day | ORAL | 4 refills | Status: DC
Start: 2017-05-18 — End: 2018-05-04

## 2017-05-18 NOTE — Telephone Encounter (Signed)
Pt aware, Rx sent. 

## 2017-05-18 NOTE — Telephone Encounter (Signed)
Yes and sorry about that.

## 2017-05-24 ENCOUNTER — Telehealth: Payer: Self-pay

## 2017-05-24 NOTE — Telephone Encounter (Signed)
I called patient because My Chart email I sent her regarding her pap result was returned unread today. Left message for her to call me or to login and read email and email me back.  "Dr. Dellis Filbert wanted you to know that your Pap smear is normal. 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 Dr. Dellis Filbert recommended that she repeat your Pap Smear in 1 year.  (HPV is very common virus. The body will fight it off on it's own. Nothing to do for it. Just observe pap smears a little closer until it is gone".

## 2017-07-08 ENCOUNTER — Telehealth: Payer: Self-pay

## 2017-07-08 NOTE — Telephone Encounter (Signed)
Not below documented in wrong patient's chart.

## 2017-07-08 NOTE — Telephone Encounter (Deleted)
I called [facility] and spoke with rep and received authorization #[id number] valid 07/08/17 through 08/06/17. Added this info to appointment notes.

## 2017-07-28 NOTE — Telephone Encounter (Signed)
Left message to call.

## 2017-08-20 NOTE — Telephone Encounter (Signed)
Recall placed

## 2018-05-04 ENCOUNTER — Ambulatory Visit (INDEPENDENT_AMBULATORY_CARE_PROVIDER_SITE_OTHER): Payer: 59 | Admitting: Obstetrics & Gynecology

## 2018-05-04 ENCOUNTER — Encounter: Payer: Self-pay | Admitting: Obstetrics & Gynecology

## 2018-05-04 VITALS — BP 120/78 | Ht 66.0 in | Wt 139.0 lb

## 2018-05-04 DIAGNOSIS — Z113 Encounter for screening for infections with a predominantly sexual mode of transmission: Secondary | ICD-10-CM

## 2018-05-04 DIAGNOSIS — Z3041 Encounter for surveillance of contraceptive pills: Secondary | ICD-10-CM | POA: Diagnosis not present

## 2018-05-04 DIAGNOSIS — Z01419 Encounter for gynecological examination (general) (routine) without abnormal findings: Secondary | ICD-10-CM | POA: Diagnosis not present

## 2018-05-04 MED ORDER — NORETHIN ACE-ETH ESTRAD-FE 1.5-30 MG-MCG PO TABS: 1.0000 | ORAL_TABLET | Freq: Every day | ORAL | 4 refills | Status: DC

## 2018-05-04 NOTE — Patient Instructions (Signed)
1. Encounter for routine gynecological examination with Papanicolaou smear of cervix Normal gynecologic exam.  Pap with high-risk HPV done today.  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  HPV 16, 18 and 45 were negative.  Breast exam normal.  Occasional probably intestinal discomfort.  Patient will do a journal of her nutrition, physical activity and episodes of pain, diarrhea and vomiting.  Possible gluten intolerance or IBS.  Will find a family physician in [place] where she lives.  Eventual referral to gastroenterology as needed.  2. Encounter for surveillance of contraceptive pills Well on Microgestin 1.5/30.  No contraindication to the birth control pill.  Same birth control pill prescribed.  3. Screen for STD (sexually transmitted disease) Continue with strict condom use. - Gono-Chlam on Pap - HIV antibody (with reflex) - RPR - Hepatitis C Antibody - Hepatitis B Surface AntiGEN  Other orders - norethindrone-ethinyl estradiol-iron (MICROGESTIN FE,GILDESS FE,LOESTRIN FE) 1.5-30 MG-MCG tablet; Take 1 tablet by mouth daily.  Jodi Bishop, it was a pleasure seeing you today!  I will inform you of your results as soon as they are available.

## 2018-05-04 NOTE — Progress Notes (Signed)
Jodi Bishop [birth year] [id number]   History:    37 y.o. G0 Boyfriend.  Lives in [place].  RP:  Established patient presenting for annual gyn exam   HPI: Well on Microgestin 1.5/30.  Light periods.  No breakthrough bleeding.  No cyclic pelvic pain.  Normal vaginal secretions.  No pain with intercourse.  Strict condom use, as her boyfriend and her are not exclusive.  Urine normal.  Bowel movements normal usually.  Occasional low abdominal pains radiating to the back associated with some diarrhea and vomiting.  Those episodes are not frequent and the pain level is a 3/10.  Patient is trying to see if she has difficulty tolerating certain foods.  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  No regular physical activity currently.  Body mass index 22.44.  Breasts normal.  Past medical history,surgical history, family history and social history were all reviewed and documented in the EPIC chart.  Gynecologic History Patient's last menstrual period was 03/18/2018. Contraception: OCP (estrogen/progesterone) Last Pap: 04/2017. Results were: Negative, but HPV HR pos.  HPV 16-18-45 negative. Last mammogram: Never Bone Density: Never Colonoscopy: Never  Obstetric History OB History  Gravida Para Term Preterm AB Living  0 0 0 0 0 0  SAB TAB Ectopic Multiple Live Births  0 0 0 0 0     ROS: A ROS was performed and pertinent positives and negatives are included in the history.  GENERAL: No fevers or chills. HEENT: No change in vision, no earache, sore throat or sinus congestion. NECK: No pain or stiffness. CARDIOVASCULAR: No chest pain or pressure. No palpitations. PULMONARY: No shortness of breath, cough or wheeze. GASTROINTESTINAL: No abdominal pain, nausea, vomiting or diarrhea, melena or bright red blood per rectum. GENITOURINARY: No urinary frequency, urgency, hesitancy or dysuria. MUSCULOSKELETAL: No joint or muscle pain, no back pain, no recent trauma. DERMATOLOGIC: No rash, no  itching, no lesions. ENDOCRINE: No polyuria, polydipsia, no heat or cold intolerance. No recent change in weight. HEMATOLOGICAL: No anemia or easy bruising or bleeding. NEUROLOGIC: No headache, seizures, numbness, tingling or weakness. PSYCHIATRIC: No depression, no loss of interest in normal activity or change in sleep pattern.     Exam:   BP 120/78 (BP Location: Right Arm, Patient Position: Sitting, Cuff Size: Normal)   Ht 5\' 6"  (1.676 m)   Wt 139 lb (63 kg)   LMP 03/18/2018   BMI 22.44 kg/m   Body mass index is 22.44 kg/m.  General appearance : Well developed well nourished female. No acute distress HEENT: Eyes: no retinal hemorrhage or exudates,  Neck supple, trachea midline, no carotid bruits, no thyroidmegaly Lungs: Clear to auscultation, no rhonchi or wheezes, or rib retractions  Heart: Regular rate and rhythm, no murmurs or gallops Breast:Examined in sitting and supine position were symmetrical in appearance, no palpable masses or tenderness,  no skin retraction, no nipple inversion, no nipple discharge, no skin discoloration, no axillary or supraclavicular lymphadenopathy Abdomen: no palpable masses or tenderness, no rebound or guarding Extremities: no edema or skin discoloration or tenderness  Pelvic: Vulva: Normal             Vagina: No gross lesions or discharge  Cervix: No gross lesions or discharge.  Pap/HPV HR, Gono-Chlam done.  Uterus  AV, normal size, shape and consistency, non-tender and mobile  Adnexa  Without masses or tenderness  Anus: Normal   Assessment/Plan:  37 y.o. female for annual exam   1. Encounter for routine gynecological  examination with Papanicolaou smear of cervix Normal gynecologic exam.  Pap with high-risk HPV done today.  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  HPV 16, 18 and 45 were negative.  Breast exam normal.  Occasional probably intestinal discomfort.  Patient will do a journal of her nutrition, physical activity and  episodes of pain, diarrhea and vomiting.  Possible gluten intolerance or IBS.  Will find a family physician in [place] where she lives.  Eventual referral to gastroenterology as needed.  2. Encounter for surveillance of contraceptive pills Well on Microgestin 1.5/30.  No contraindication to the birth control pill.  Same birth control pill prescribed.  3. Screen for STD (sexually transmitted disease) Continue with strict condom use. - Gono-Chlam on Pap - HIV antibody (with reflex) - RPR - Hepatitis C Antibody - Hepatitis B Surface AntiGEN  Other orders - norethindrone-ethinyl estradiol-iron (MICROGESTIN FE,GILDESS FE,LOESTRIN FE) 1.5-30 MG-MCG tablet; Take 1 tablet by mouth daily.  Princess Bruins MD, 11:02 AM 05/04/2018

## 2018-05-04 NOTE — Addendum Note (Signed)
Addended by: Lorine Bears on: 05/04/2018 11:48 AM   Modules accepted: Orders

## 2018-05-05 LAB — RPR: RPR: NONREACTIVE

## 2018-05-05 LAB — HIV ANTIBODY (ROUTINE TESTING W REFLEX): HIV: NONREACTIVE

## 2018-05-05 LAB — HEPATITIS B SURFACE ANTIGEN: HEP B S AG: NONREACTIVE

## 2018-05-05 LAB — HEPATITIS C ANTIBODY
HEP C AB: NONREACTIVE
SIGNAL TO CUT-OFF: 0.02 (ref <1.00)

## 2018-05-06 LAB — PAP IG, CT-NG NAA, HPV HIGH-RISK
C. trachomatis RNA, TMA: NOT DETECTED
HPV DNA High Risk: DETECTED — AB
N. GONORRHOEAE RNA, TMA: NOT DETECTED

## 2019-05-08 ENCOUNTER — Encounter: Payer: Self-pay | Admitting: Obstetrics & Gynecology

## 2019-06-02 ENCOUNTER — Other Ambulatory Visit: Payer: Self-pay | Admitting: Obstetrics & Gynecology

## 2019-06-02 NOTE — Telephone Encounter (Signed)
Staff messaged Jodi Bishop and asked her to call patient to schedule CE since she is past due.

## 2019-08-20 ENCOUNTER — Other Ambulatory Visit: Payer: Self-pay | Admitting: Obstetrics & Gynecology

## 2020-06-20 ENCOUNTER — Encounter: Payer: Self-pay | Admitting: Obstetrics & Gynecology

## 2020-06-24 ENCOUNTER — Other Ambulatory Visit: Payer: Self-pay

## 2020-06-24 ENCOUNTER — Encounter: Payer: Self-pay | Admitting: Obstetrics & Gynecology

## 2020-06-24 ENCOUNTER — Ambulatory Visit (INDEPENDENT_AMBULATORY_CARE_PROVIDER_SITE_OTHER): Payer: 59 | Admitting: Obstetrics & Gynecology

## 2020-06-24 VITALS — BP 122/70 | Ht 66.0 in | Wt 162.0 lb

## 2020-06-24 DIAGNOSIS — Z01419 Encounter for gynecological examination (general) (routine) without abnormal findings: Secondary | ICD-10-CM | POA: Diagnosis not present

## 2020-06-24 DIAGNOSIS — Z113 Encounter for screening for infections with a predominantly sexual mode of transmission: Secondary | ICD-10-CM | POA: Diagnosis not present

## 2020-06-24 DIAGNOSIS — Z789 Other specified health status: Secondary | ICD-10-CM | POA: Diagnosis not present

## 2020-06-24 DIAGNOSIS — Z1151 Encounter for screening for human papillomavirus (HPV): Secondary | ICD-10-CM | POA: Diagnosis not present

## 2020-06-24 NOTE — Addendum Note (Signed)
Addended by: Thurnell Garbe A on: 06/24/2020 11:27 AM   Modules accepted: Orders

## 2020-06-24 NOTE — Progress Notes (Signed)
Jodi Bishop May 31, 1981 [id number]   History:    40 y.o. G0 New Boyfriend.  Lives in [place].  RP:  Established patient presenting for annual gyn exam   HPI: Menses normal and regular every month.  No breakthrough bleeding.  No cyclic pelvic pain.  Normal vaginal secretions.  No pain with intercourse.  Strict condom use with new boyfriend.  Declines additional contraception at this time.  Urine normal.  Bowel movements normal.  No regular physical activity currently.  Body mass index increased to 26.15.  Breasts normal.  Will do Fasting Health Labs here today.  Past medical history,surgical history, family history and social history were all reviewed and documented in the EPIC chart.  Gynecologic History Patient's last menstrual period was 06/01/2020.  Obstetric History OB History  Gravida Para Term Preterm AB Living  0 0 0 0 0 0  SAB TAB Ectopic Multiple Live Births  0 0 0 0 0     ROS: A ROS was performed and pertinent positives and negatives are included in the history.  GENERAL: No fevers or chills. HEENT: No change in vision, no earache, sore throat or sinus congestion. NECK: No pain or stiffness. CARDIOVASCULAR: No chest pain or pressure. No palpitations. PULMONARY: No shortness of breath, cough or wheeze. GASTROINTESTINAL: No abdominal pain, nausea, vomiting or diarrhea, melena or bright red blood per rectum. GENITOURINARY: No urinary frequency, urgency, hesitancy or dysuria. MUSCULOSKELETAL: No joint or muscle pain, no back pain, no recent trauma. DERMATOLOGIC: No rash, no itching, no lesions. ENDOCRINE: No polyuria, polydipsia, no heat or cold intolerance. No recent change in weight. HEMATOLOGICAL: No anemia or easy bruising or bleeding. NEUROLOGIC: No headache, seizures, numbness, tingling or weakness. PSYCHIATRIC: No depression, no loss of interest in normal activity or change in sleep pattern.     Exam:   BP 122/70   Ht '5\' 6"'  (1.676 m)   Wt 162 lb (73.5 kg)    LMP 06/01/2020 Comment: NO BIRTH CONTROL   BMI 26.15 kg/m   Body mass index is 26.15 kg/m.  General appearance : Well developed well nourished female. No acute distress HEENT: Eyes: no retinal hemorrhage or exudates,  Neck supple, trachea midline, no carotid bruits, no thyroidmegaly Lungs: Clear to auscultation, no rhonchi or wheezes, or rib retractions  Heart: Regular rate and rhythm, no murmurs or gallops Breast:Examined in sitting and supine position were symmetrical in appearance, no palpable masses or tenderness,  no skin retraction, no nipple inversion, no nipple discharge, no skin discoloration, no axillary or supraclavicular lymphadenopathy Abdomen: no palpable masses or tenderness, no rebound or guarding Extremities: no edema or skin discoloration or tenderness  Pelvic: Vulva: Normal             Vagina: No gross lesions or discharge  Cervix: No gross lesions or discharge.  Pap/HPV HR/Gono-Chlam done  Uterus  RV, normal size, shape and consistency, non-tender and mobile  Adnexa  Without masses or tenderness  Anus: Normal   Assessment/Plan:  39 y.o. female for annual exam   1. Encounter for routine gynecological examination with Papanicolaou smear of cervix Normal gynecologic exam.  Pap test with high-risk HPV done today.  Breast exam normal.  Body mass index 26.15.  Healthy nutrition to continue.  Aerobic activities 5 times a week and light weightlifting every 2 days recommended.  Fasting health labs done here today. - CBC - Comp Met (CMET) - TSH - Lipid panel - VITAMIN D 25 Hydroxy (Vit-D Deficiency, Fractures)  2.  Screen for STD (sexually transmitted disease) We will continue with strict condom use. - Gono-Chlam on Pap  - HIV antibody (with reflex) - RPR - Hepatitis C Antibody - Hepatitis B Surface AntiGEN  3. Use of condoms for contraception Using condoms for contraception and STI prevention.  Declines additional contraception at this time.  Princess Bruins  MD, 10:15 AM 06/24/2020

## 2020-06-25 LAB — COMPREHENSIVE METABOLIC PANEL
AG Ratio: 1.3 (calc) (ref 1.0–2.5)
ALT: 9 U/L (ref 6–29)
AST: 13 U/L (ref 10–30)
Albumin: 4.3 g/dL (ref 3.6–5.1)
Alkaline phosphatase (APISO): 84 U/L (ref 31–125)
BUN: 9 mg/dL (ref 7–25)
CO2: 23 mmol/L (ref 20–32)
Calcium: 9.6 mg/dL (ref 8.6–10.2)
Chloride: 105 mmol/L (ref 98–110)
Creat: 0.71 mg/dL (ref 0.50–1.10)
Globulin: 3.3 g/dL (calc) (ref 1.9–3.7)
Glucose, Bld: 82 mg/dL (ref 65–99)
Potassium: 3.9 mmol/L (ref 3.5–5.3)
Sodium: 138 mmol/L (ref 135–146)
Total Bilirubin: 0.8 mg/dL (ref 0.2–1.2)
Total Protein: 7.6 g/dL (ref 6.1–8.1)

## 2020-06-25 LAB — HEPATITIS B SURFACE ANTIGEN: Hepatitis B Surface Ag: NONREACTIVE

## 2020-06-25 LAB — TSH: TSH: 1.25 mIU/L

## 2020-06-25 LAB — LIPID PANEL
Cholesterol: 123 mg/dL (ref <200)
HDL: 50 mg/dL (ref >=50)
LDL Cholesterol (Calc): 60 mg/dL (calc)
Non-HDL Cholesterol (Calc): 73 mg/dL (calc) (ref <130)
Total CHOL/HDL Ratio: 2.5 (calc) (ref <5.0)
Triglycerides: 57 mg/dL (ref <150)

## 2020-06-25 LAB — CBC
HCT: 38.8 % (ref 35.0–45.0)
Hemoglobin: 12.8 g/dL (ref 11.7–15.5)
MCH: 31.8 pg (ref 27.0–33.0)
MCHC: 33 g/dL (ref 32.0–36.0)
MCV: 96.5 fL (ref 80.0–100.0)
MPV: 10.1 fL (ref 7.5–12.5)
Platelets: 382 10*3/uL (ref 140–400)
RBC: 4.02 10*6/uL (ref 3.80–5.10)
RDW: 11.5 % (ref 11.0–15.0)
WBC: 6.2 10*3/uL (ref 3.8–10.8)

## 2020-06-25 LAB — HIV ANTIBODY (ROUTINE TESTING W REFLEX): HIV 1&2 Ab, 4th Generation: NONREACTIVE

## 2020-06-25 LAB — HEPATITIS C ANTIBODY
Hepatitis C Ab: NONREACTIVE
SIGNAL TO CUT-OFF: 0.01 (ref <1.00)

## 2020-06-25 LAB — VITAMIN D 25 HYDROXY (VIT D DEFICIENCY, FRACTURES): Vit D, 25-Hydroxy: 20 ng/mL — ABNORMAL LOW (ref 30–100)

## 2020-06-25 LAB — RPR: RPR Ser Ql: NONREACTIVE

## 2020-06-26 LAB — PAP IG, CT-NG NAA, HPV HIGH-RISK
C. trachomatis RNA, TMA: NOT DETECTED
HPV DNA High Risk: NOT DETECTED
N. gonorrhoeae RNA, TMA: NOT DETECTED

## 2021-06-25 ENCOUNTER — Ambulatory Visit: Payer: 59 | Admitting: Obstetrics & Gynecology

## 2021-08-04 ENCOUNTER — Ambulatory Visit: Payer: 59 | Admitting: Obstetrics & Gynecology

## 2021-08-12 ENCOUNTER — Encounter: Payer: Self-pay | Admitting: Obstetrics & Gynecology

## 2021-08-12 ENCOUNTER — Other Ambulatory Visit: Payer: Self-pay

## 2021-08-12 ENCOUNTER — Ambulatory Visit (INDEPENDENT_AMBULATORY_CARE_PROVIDER_SITE_OTHER): Payer: Commercial Managed Care - PPO | Admitting: Obstetrics & Gynecology

## 2021-08-12 ENCOUNTER — Other Ambulatory Visit (HOSPITAL_COMMUNITY)
Admission: RE | Admit: 2021-08-12 | Discharge: 2021-08-12 | Disposition: A | Discharge status: Home / Self Care | Payer: No Typology Code available for payment source | Source: Ambulatory Visit | Attending: Obstetrics & Gynecology | Admitting: Obstetrics & Gynecology

## 2021-08-12 VITALS — BP 110/80 | HR 80 | Resp 16 | Ht 66.25 in | Wt 157.0 lb

## 2021-08-12 DIAGNOSIS — Z01419 Encounter for gynecological examination (general) (routine) without abnormal findings: Secondary | ICD-10-CM

## 2021-08-12 DIAGNOSIS — Z113 Encounter for screening for infections with a predominantly sexual mode of transmission: Secondary | ICD-10-CM | POA: Insufficient documentation

## 2021-08-12 DIAGNOSIS — Z789 Other specified health status: Secondary | ICD-10-CM

## 2021-08-12 NOTE — Progress Notes (Signed)
Jodi Bishop 1981-02-06 [id number]   History:    40 y.o.  G0 Single. Lives in [place].   RP:  Established patient presenting for annual gyn exam    HPI: Menses normal and regular every month.  No breakthrough bleeding.  No cyclic pelvic pain.  Normal vaginal secretions. H/O CIN 1 in 2017. Pap Neg/HPV HR Neg in 2021.  No pain with intercourse.  Strict condom use as needed.  Declines additional contraception at this time.  Breasts normal.  Urine normal.  Bowel movements normal.  Body mass index improved to 25.15. Good fitness and nutrition.  Will do Fasting Health Labs here today.  Past medical history,surgical history, family history and social history were all reviewed and documented in the EPIC chart.  Gynecologic History Patient's last menstrual period was 08/03/2021 (exact date).  Obstetric History OB History  Gravida Para Term Preterm AB Living  0 0 0 0 0 0  SAB IAB Ectopic Multiple Live Births  0 0 0 0 0     ROS: A ROS was performed and pertinent positives and negatives are included in the history.  GENERAL: No fevers or chills. HEENT: No change in vision, no earache, sore throat or sinus congestion. NECK: No pain or stiffness. CARDIOVASCULAR: No chest pain or pressure. No palpitations. PULMONARY: No shortness of breath, cough or wheeze. GASTROINTESTINAL: No abdominal pain, nausea, vomiting or diarrhea, melena or bright red blood per rectum. GENITOURINARY: No urinary frequency, urgency, hesitancy or dysuria. MUSCULOSKELETAL: No joint or muscle pain, no back pain, no recent trauma. DERMATOLOGIC: No rash, no itching, no lesions. ENDOCRINE: No polyuria, polydipsia, no heat or cold intolerance. No recent change in weight. HEMATOLOGICAL: No anemia or easy bruising or bleeding. NEUROLOGIC: No headache, seizures, numbness, tingling or weakness. PSYCHIATRIC: No depression, no loss of interest in normal activity or change in sleep pattern.     Exam:   BP 110/80   Pulse 80   Resp  16   Ht 5' 6.25" (1.683 m)   Wt 157 lb (71.2 kg)   LMP 08/03/2021 (Exact Date)   BMI 25.15 kg/m   Body mass index is 25.15 kg/m.  General appearance : Well developed well nourished female. No acute distress HEENT: Eyes: no retinal hemorrhage or exudates,  Neck supple, trachea midline, no carotid bruits, no thyroidmegaly Lungs: Clear to auscultation, no rhonchi or wheezes, or rib retractions  Heart: Regular rate and rhythm, no murmurs or gallops Breast:Examined in sitting and supine position were symmetrical in appearance, no palpable masses or tenderness,  no skin retraction, no nipple inversion, no nipple discharge, no skin discoloration, no axillary or supraclavicular lymphadenopathy Abdomen: no palpable masses or tenderness, no rebound or guarding Extremities: no edema or skin discoloration or tenderness  Pelvic: Vulva: Normal             Vagina: No gross lesions or discharge  Cervix: No gross lesions or discharge.  Pap reflex/Gono-Chlam  Uterus  RV, normal size, shape and consistency, non-tender and mobile  Adnexa  Without masses or tenderness  Anus: Normal   Assessment/Plan:  40 y.o. female for annual exam   1. Encounter for routine gynecological examination with Papanicolaou smear of cervix Normal gynecologic exam.  History of CIN-1 in 2017 with high risk HPV positive.  Last Pap test in 2021 was negative with negative high-risk HPV.  Pap reflex done today.  Breast exam normal.  Improved body mass index at 25.15.  Continue with fitness and healthy nutrition.  Fasting  health labs done today here. - CBC - Comp Met (CMET) - TSH - Vitamin D 1,25 dihydroxy - Lipid Profile - Cytology - PAP( [place])  2. Use of condoms for contraception Declines additional contraception.  3. Screen for STD (sexually transmitted disease) Strict condom use as needed. - Cytology - PAP( [place]), Gono-Chlam  - HIV antibody (with reflex) - RPR - Hepatitis B Surface AntiGEN - Hepatitis C  Antibody   Princess Bruins MD, 9:47 AM 08/12/2021

## 2021-08-13 LAB — CYTOLOGY - PAP
Chlamydia: NEGATIVE
Comment: NEGATIVE
Comment: NORMAL
Diagnosis: NEGATIVE
Neisseria Gonorrhea: NEGATIVE

## 2021-08-15 LAB — HEPATITIS C ANTIBODY
Hepatitis C Ab: NONREACTIVE
SIGNAL TO CUT-OFF: 0.05 (ref <1.00)

## 2021-08-15 LAB — COMPREHENSIVE METABOLIC PANEL
AG Ratio: 1.1 (calc) (ref 1.0–2.5)
ALT: 8 U/L (ref 6–29)
AST: 11 U/L (ref 10–30)
Albumin: 4.2 g/dL (ref 3.6–5.1)
Alkaline phosphatase (APISO): 94 U/L (ref 31–125)
BUN: 12 mg/dL (ref 7–25)
CO2: 25 mmol/L (ref 20–32)
Calcium: 9.6 mg/dL (ref 8.6–10.2)
Chloride: 106 mmol/L (ref 98–110)
Creat: 0.64 mg/dL (ref 0.50–0.97)
Globulin: 3.7 g/dL (calc) (ref 1.9–3.7)
Glucose, Bld: 76 mg/dL (ref 65–99)
Potassium: 4.2 mmol/L (ref 3.5–5.3)
Sodium: 140 mmol/L (ref 135–146)
Total Bilirubin: 0.4 mg/dL (ref 0.2–1.2)
Total Protein: 7.9 g/dL (ref 6.1–8.1)

## 2021-08-15 LAB — CBC
HCT: 36 % (ref 35.0–45.0)
Hemoglobin: 12.1 g/dL (ref 11.7–15.5)
MCH: 31.8 pg (ref 27.0–33.0)
MCHC: 33.6 g/dL (ref 32.0–36.0)
MCV: 94.5 fL (ref 80.0–100.0)
MPV: 9.7 fL (ref 7.5–12.5)
Platelets: 389 10*3/uL (ref 140–400)
RBC: 3.81 10*6/uL (ref 3.80–5.10)
RDW: 11.5 % (ref 11.0–15.0)
WBC: 5.1 10*3/uL (ref 3.8–10.8)

## 2021-08-15 LAB — VITAMIN D 1,25 DIHYDROXY
Vitamin D 1, 25 (OH)2 Total: 68 pg/mL (ref 18–72)
Vitamin D2 1, 25 (OH)2: <8 pg/mL
Vitamin D3 1, 25 (OH)2: 68 pg/mL

## 2021-08-15 LAB — LIPID PANEL
Cholesterol: 138 mg/dL (ref <200)
HDL: 49 mg/dL — ABNORMAL LOW (ref >=50)
LDL Cholesterol (Calc): 78 mg/dL (calc)
Non-HDL Cholesterol (Calc): 89 mg/dL (calc) (ref <130)
Total CHOL/HDL Ratio: 2.8 (calc) (ref <5.0)
Triglycerides: 40 mg/dL (ref <150)

## 2021-08-15 LAB — HIV ANTIBODY (ROUTINE TESTING W REFLEX): HIV 1&2 Ab, 4th Generation: NONREACTIVE

## 2021-08-15 LAB — TSH: TSH: 2.04 mIU/L

## 2021-08-15 LAB — RPR: RPR Ser Ql: NONREACTIVE

## 2021-08-15 LAB — HEPATITIS B SURFACE ANTIGEN: Hepatitis B Surface Ag: NONREACTIVE

## 2022-06-30 ENCOUNTER — Encounter: Payer: Self-pay | Admitting: Obstetrics & Gynecology

## 2022-06-30 ENCOUNTER — Ambulatory Visit: Payer: 59 | Admitting: Obstetrics & Gynecology

## 2022-06-30 VITALS — BP 112/70 | HR 68 | Resp 16

## 2022-06-30 DIAGNOSIS — Z113 Encounter for screening for infections with a predominantly sexual mode of transmission: Secondary | ICD-10-CM | POA: Diagnosis not present

## 2022-06-30 DIAGNOSIS — N926 Irregular menstruation, unspecified: Secondary | ICD-10-CM | POA: Diagnosis not present

## 2022-06-30 DIAGNOSIS — Z789 Other specified health status: Secondary | ICD-10-CM

## 2022-06-30 NOTE — Progress Notes (Signed)
    Jodi Bishop 1980-12-28 [id number]        41 y.o.  G0  RP: Irregular menses with metrorrhagia x 2 months  HPI: Light menses up to 7 days every month, but had light bleeding starting again a few days after her menses in July and August.  Patient started running and noticed that the BTB happens more after running.  No sexual activity x 03/2022 and used condoms.  No pelvic pain.  No fever.  Urine and BMs normal.   OB History  Gravida Para Term Preterm AB Living  0 0 0 0 0 0  SAB IAB Ectopic Multiple Live Births  0 0 0 0 0    Past medical history,surgical history, problem list, medications, allergies, family history and social history were all reviewed and documented in the EPIC chart.   Directed ROS with pertinent positives and negatives documented in the history of present illness/assessment and plan.  Exam:  Vitals:   06/30/22 0944  BP: 112/70  Pulse: 68  Resp: 16   General appearance:  Normal  Abdomen: Normal  Gynecologic exam: Vulva normal.  Speculum:  Cervix/Vagina normal.  Normal secretions.  Gono-Chlam done.  Bimanual exam:  Uterus AV,normal volume, mobile, NT.  No adnexal mass, NT.   Assessment/Plan:  41 y.o. G0  1. Irregular menses Light menses up to 7 days every month, but had light bleeding starting again a few days after her menses in July and August.  Patient started running and noticed that the BTB happens more after running.  No sexual activity x 03/2022 and used condoms.  No pelvic pain.  No fever.  Urine and BMs normal.  Normal Gyn exam.  Will r/o Endocrine disorders with a Prl and TSH today.  F/U pelvic US to investigate the Endometrial line further.  Gono- Chlam pending.  Counseling done on irregular menses, its causes, investigation and treatment approaches. - Prolactin - TSH - US Transvaginal Non-OB; Future  2. Use of condoms for contraception  3. Screen for STD (sexually transmitted disease) - C. trachomatis/N. gonorrhoeae RNA  Other orders -  Multiple Vitamin (MULTIVITAMIN PO); Take by mouth. - COLLAGEN PO; Take by mouth.   Princess Bruins MD, 10:05 AM 06/30/2022

## 2022-07-01 LAB — C. TRACHOMATIS/N. GONORRHOEAE RNA
C. trachomatis RNA, TMA: NOT DETECTED
N. gonorrhoeae RNA, TMA: NOT DETECTED

## 2022-07-01 LAB — TSH: TSH: 1.17 mIU/L

## 2022-07-01 LAB — PROLACTIN: Prolactin: 14.1 ng/mL

## 2022-08-03 ENCOUNTER — Telehealth: Payer: Self-pay

## 2022-08-03 NOTE — Telephone Encounter (Signed)
Patient has u/s appt scheduled Thursday at 4pm. She called because she is on her menses this week and questioned if this will be ok with u/s.

## 2022-08-05 NOTE — Telephone Encounter (Signed)
I spoke with Dr. Marguerita Merles about this.  She said if moderate to light flow okay to come for u/s. If heavy should reschedule. Patient said today seems lighter than yesterday that was very heavy and usually by Day 5 she slows down and tomorrow is Day 5.  She will observe and plan to keep the appt.

## 2022-08-06 ENCOUNTER — Encounter: Payer: Self-pay | Admitting: Obstetrics & Gynecology

## 2022-08-06 ENCOUNTER — Ambulatory Visit (INDEPENDENT_AMBULATORY_CARE_PROVIDER_SITE_OTHER): Payer: 59 | Admitting: Obstetrics & Gynecology

## 2022-08-06 ENCOUNTER — Ambulatory Visit (INDEPENDENT_AMBULATORY_CARE_PROVIDER_SITE_OTHER): Payer: 59

## 2022-08-06 VITALS — BP 112/78 | HR 90

## 2022-08-06 DIAGNOSIS — N926 Irregular menstruation, unspecified: Secondary | ICD-10-CM | POA: Diagnosis not present

## 2022-08-06 DIAGNOSIS — Z789 Other specified health status: Secondary | ICD-10-CM | POA: Diagnosis not present

## 2022-08-06 DIAGNOSIS — D219 Benign neoplasm of connective and other soft tissue, unspecified: Secondary | ICD-10-CM | POA: Diagnosis not present

## 2022-08-06 NOTE — Progress Notes (Signed)
    Jodi Bishop 1981/09/22 [id number]        41 y.o.  G0 Single  RP: Irregular menses with metrorrhagia for Pelvic US  HPI: Light menses up to 7 days every month, but had light bleeding starting again a few days after her menses in July and August.  Patient started running and noticed that the BTB happens more after running. No sexual activity x 03/2022 and used condoms.  No pelvic pain.  No fever.  Urine and BMs normal.   OB History  Gravida Para Term Preterm AB Living  0 0 0 0 0 0  SAB IAB Ectopic Multiple Live Births  0 0 0 0 0    Past medical history,surgical history, problem list, medications, allergies, family history and social history were all reviewed and documented in the EPIC chart.   Directed ROS with pertinent positives and negatives documented in the history of present illness/assessment and plan.  Exam:  New Set of Vitals Flowsheets   08/06/2022 4:49 PM  BP 112/78  Pulse 90  SpO2 99 %     General appearance:  Normal  Pelvic US today: T/V images.  Retroverted uterus normal in size and shape with several intramural and subserosal fibroids.  The overall uterine size is measured at 7.43 x 6.26 x 4.54 cm.  5 fibroids are present, measured from 2.47 cm to 0.76 cm. The endometrial lining is thin and symmetrical measured at 3 mm with no mass or thickening or abnormal blood flow seen.  Both ovaries are normal in size with normal follicular pattern and normal perfusion.  No adnexal mass.  No free fluid in the pelvis.   Assessment/Plan:  42 y.o. G0  1. Irregular menses Pelvic US findings thoroughly reviewed with patient.  Reassured about the overall normal uterine size with small fibroids and the thin and normal endometrial line.  Both ovaries normal.  Decision to observe the natural menstrual pattern.  Currently abstinent. Will call back if needs contraception.  2. Fibroids Counseling on fibroids done.  3. Use of condoms for contraception   Princess Bruins  MD, 4:26 PM 08/06/2022

## 2022-08-09 ENCOUNTER — Encounter: Payer: Self-pay | Admitting: Obstetrics & Gynecology

## 2022-08-13 ENCOUNTER — Encounter: Payer: Self-pay | Admitting: Obstetrics & Gynecology

## 2022-08-13 ENCOUNTER — Ambulatory Visit: Payer: Commercial Managed Care - PPO | Admitting: Obstetrics & Gynecology

## 2022-08-13 ENCOUNTER — Other Ambulatory Visit (HOSPITAL_COMMUNITY)
Admission: RE | Admit: 2022-08-13 | Discharge: 2022-08-13 | Disposition: A | Discharge status: Home / Self Care | Payer: 59 | Source: Ambulatory Visit | Attending: Obstetrics & Gynecology | Admitting: Obstetrics & Gynecology

## 2022-08-13 ENCOUNTER — Ambulatory Visit (INDEPENDENT_AMBULATORY_CARE_PROVIDER_SITE_OTHER): Payer: 59 | Admitting: Obstetrics & Gynecology

## 2022-08-13 VITALS — BP 106/72 | HR 74 | Ht 66.75 in | Wt 151.0 lb

## 2022-08-13 DIAGNOSIS — Z01419 Encounter for gynecological examination (general) (routine) without abnormal findings: Secondary | ICD-10-CM | POA: Diagnosis not present

## 2022-08-13 DIAGNOSIS — Z789 Other specified health status: Secondary | ICD-10-CM | POA: Diagnosis not present

## 2022-08-13 DIAGNOSIS — D219 Benign neoplasm of connective and other soft tissue, unspecified: Secondary | ICD-10-CM | POA: Diagnosis not present

## 2022-08-13 NOTE — Progress Notes (Signed)
Jodi Bishop 1980-11-16 [id number]   History:    41 y.o.  G0 Single. Lives in [place].   RP:  Established patient presenting for annual gyn exam    HPI: Heavier regular menses every month.  No breakthrough bleeding. Pelvic US showed small Uterine Fibroids on 08/06/22.  No cyclic pelvic pain.  Normal vaginal secretions. H/O CIN 1 in 2017. Pap Neg/HPV HR Neg in 2021.  Pap Neg 07/2021.  Pap reflex today. No pain with intercourse.  Strict condom use as needed.  Currently abstinent. Declines additional contraception at this time.  Breasts normal.  Will do screening mammo at 42 in 2024.  Urine normal.  Bowel movements normal.  Body mass index improved to 23.83. Good fitness and nutrition.  Will do Fasting Health Labs here today.   Past medical history,surgical history, family history and social history were all reviewed and documented in the EPIC chart.  Gynecologic History Patient's last menstrual period was 08/02/2022 (exact date).  Obstetric History OB History  Gravida Para Term Preterm AB Living  0 0 0 0 0 0  SAB IAB Ectopic Multiple Live Births  0 0 0 0 0     ROS: A ROS was performed and pertinent positives and negatives are included in the history. GENERAL: No fevers or chills. HEENT: No change in vision, no earache, sore throat or sinus congestion. NECK: No pain or stiffness. CARDIOVASCULAR: No chest pain or pressure. No palpitations. PULMONARY: No shortness of breath, cough or wheeze. GASTROINTESTINAL: No abdominal pain, nausea, vomiting or diarrhea, melena or bright red blood per rectum. GENITOURINARY: No urinary frequency, urgency, hesitancy or dysuria. MUSCULOSKELETAL: No joint or muscle pain, no back pain, no recent trauma. DERMATOLOGIC: No rash, no itching, no lesions. ENDOCRINE: No polyuria, polydipsia, no heat or cold intolerance. No recent change in weight. HEMATOLOGICAL: No anemia or easy bruising or bleeding. NEUROLOGIC: No headache, seizures, numbness, tingling or  weakness. PSYCHIATRIC: No depression, no loss of interest in normal activity or change in sleep pattern.     Exam:   BP 106/72   Pulse 74   Ht 5' 6.75" (1.695 m)   Wt 151 lb (68.5 kg)   LMP 08/02/2022 (Exact Date)   SpO2 99%   BMI 23.83 kg/m   Body mass index is 23.83 kg/m.  General appearance : Well developed well nourished female. No acute distress HEENT: Eyes: no retinal hemorrhage or exudates,  Neck supple, trachea midline, no carotid bruits, no thyroidmegaly Lungs: Clear to auscultation, no rhonchi or wheezes, or rib retractions  Heart: Regular rate and rhythm, no murmurs or gallops Breast:Examined in sitting and supine position were symmetrical in appearance, no palpable masses or tenderness,  no skin retraction, no nipple inversion, no nipple discharge, no skin discoloration, no axillary or supraclavicular lymphadenopathy Abdomen: no palpable masses or tenderness, no rebound or guarding Extremities: no edema or skin discoloration or tenderness  Pelvic: Vulva: Normal             Vagina: No gross lesions or discharge  Cervix: No gross lesions or discharge.  Pap reflex done.  Uterus  AV, normal size, shape and consistency, non-tender and mobile  Adnexa  Without masses or tenderness  Anus: Normal   Assessment/Plan:  41 y.o. female for annual exam   1. Encounter for routine gynecological examination with Papanicolaou smear of cervix Heavier regular menses every month.  No breakthrough bleeding. Pelvic US showed small Uterine Fibroids on 08/06/22.  No cyclic pelvic pain.  Normal vaginal  secretions. H/O CIN 1 in 2017. Pap Neg/HPV HR Neg in 2021.  Pap Neg 07/2021.  Pap reflex today. No pain with intercourse.  Strict condom use as needed.  Currently abstinent. Declines additional contraception at this time.  Breasts normal.  Will do screening mammo at 42 in 2024.  Urine normal.  Bowel movements normal.  Body mass index improved to 23.83. Good fitness and nutrition.  Will do Fasting  Health Labs here today. - CBC - Comp Met (CMET) - Lipid Profile - Cytology - PAP( [place])  2. Use of condoms for contraception  3. Fibroids  Small Uterine Fibroids on Pelvic US 07/07/22.  Princess Bruins MD, 9:06 AM 08/13/2022

## 2022-08-14 LAB — COMPREHENSIVE METABOLIC PANEL
AG Ratio: 1.1 (calc) (ref 1.0–2.5)
ALT: 9 U/L (ref 6–29)
AST: 14 U/L (ref 10–30)
Albumin: 4.3 g/dL (ref 3.6–5.1)
Alkaline phosphatase (APISO): 83 U/L (ref 31–125)
BUN: 12 mg/dL (ref 7–25)
CO2: 24 mmol/L (ref 20–32)
Calcium: 9.3 mg/dL (ref 8.6–10.2)
Chloride: 105 mmol/L (ref 98–110)
Creat: 0.8 mg/dL (ref 0.50–0.97)
Globulin: 3.8 g/dL (calc) — ABNORMAL HIGH (ref 1.9–3.7)
Glucose, Bld: 80 mg/dL (ref 65–99)
Potassium: 3.9 mmol/L (ref 3.5–5.3)
Sodium: 138 mmol/L (ref 135–146)
Total Bilirubin: 0.6 mg/dL (ref 0.2–1.2)
Total Protein: 8.1 g/dL (ref 6.1–8.1)

## 2022-08-14 LAB — CBC
HCT: 37.3 % (ref 35.0–45.0)
Hemoglobin: 12.5 g/dL (ref 11.7–15.5)
MCH: 32.3 pg (ref 27.0–33.0)
MCHC: 33.5 g/dL (ref 32.0–36.0)
MCV: 96.4 fL (ref 80.0–100.0)
MPV: 9.9 fL (ref 7.5–12.5)
Platelets: 373 10*3/uL (ref 140–400)
RBC: 3.87 10*6/uL (ref 3.80–5.10)
RDW: 11.2 % (ref 11.0–15.0)
WBC: 4.9 10*3/uL (ref 3.8–10.8)

## 2022-08-14 LAB — LIPID PANEL
Cholesterol: 128 mg/dL (ref <200)
HDL: 55 mg/dL (ref >=50)
LDL Cholesterol (Calc): 60 mg/dL (calc)
Non-HDL Cholesterol (Calc): 73 mg/dL (calc) (ref <130)
Total CHOL/HDL Ratio: 2.3 (calc) (ref <5.0)
Triglycerides: 46 mg/dL (ref <150)

## 2022-08-17 LAB — CYTOLOGY - PAP
Comment: NEGATIVE
Diagnosis: NEGATIVE
High risk HPV: NEGATIVE

## 2023-08-17 ENCOUNTER — Other Ambulatory Visit: Payer: Self-pay | Admitting: Obstetrics and Gynecology

## 2023-08-17 DIAGNOSIS — Z0000 Encounter for general adult medical examination without abnormal findings: Secondary | ICD-10-CM

## 2023-08-20 ENCOUNTER — Encounter: Payer: Self-pay | Admitting: Obstetrics and Gynecology

## 2023-08-20 ENCOUNTER — Ambulatory Visit: Payer: 59 | Admitting: Obstetrics & Gynecology

## 2023-08-20 ENCOUNTER — Ambulatory Visit (INDEPENDENT_AMBULATORY_CARE_PROVIDER_SITE_OTHER): Payer: 59 | Admitting: Obstetrics and Gynecology

## 2023-08-20 VITALS — BP 124/84 | HR 90 | Ht 66.5 in | Wt 146.0 lb

## 2023-08-20 DIAGNOSIS — D219 Benign neoplasm of connective and other soft tissue, unspecified: Secondary | ICD-10-CM | POA: Insufficient documentation

## 2023-08-20 DIAGNOSIS — N946 Dysmenorrhea, unspecified: Secondary | ICD-10-CM | POA: Diagnosis not present

## 2023-08-20 DIAGNOSIS — N870 Mild cervical dysplasia: Secondary | ICD-10-CM | POA: Insufficient documentation

## 2023-08-20 DIAGNOSIS — N926 Irregular menstruation, unspecified: Secondary | ICD-10-CM | POA: Diagnosis not present

## 2023-08-20 DIAGNOSIS — Z01419 Encounter for gynecological examination (general) (routine) without abnormal findings: Secondary | ICD-10-CM | POA: Insufficient documentation

## 2023-08-20 LAB — CBC
HCT: 35.4 % (ref 35.0–45.0)
Hemoglobin: 11.3 g/dL — ABNORMAL LOW (ref 11.7–15.5)
MCH: 29.8 pg (ref 27.0–33.0)
MCHC: 31.9 g/dL — ABNORMAL LOW (ref 32.0–36.0)
MCV: 93.4 fL (ref 80.0–100.0)
MPV: 10.4 fL (ref 7.5–12.5)
Platelets: 331 10*3/uL (ref 140–400)
RBC: 3.79 10*6/uL — ABNORMAL LOW (ref 3.80–5.10)
RDW: 12.1 % (ref 11.0–15.0)
WBC: 5.1 10*3/uL (ref 3.8–10.8)

## 2023-08-20 NOTE — Assessment & Plan Note (Signed)
2017 CIN 1 Normal PAP, HPV neg 2021-2023 Repeat PAP in 3-5 years

## 2023-08-20 NOTE — Assessment & Plan Note (Addendum)
S/p w/u in [address] Normal TSH, PRL, CBC, PAP, GC/CT TVUS with small fibroids Patient with improvement in symptoms this year Discussed need for EMB with worsening or changing symptoms

## 2023-08-20 NOTE — Progress Notes (Signed)
42 y.o. G0P0000 female with AUB-F, hx of CIN 1 (2017) here for annual exam. Single. Lives in [place].  Patient's last menstrual period was 08/06/2023. Period Cycle (Days): 30 Period Duration (Days): 7-10 Period Pattern: (!) Irregular Menstrual Flow: Heavy Menstrual Control: Maxi pad, Tampon Dysmenorrhea: (!) Moderate  Abnormal bleeding: notes ongoing IMB (1-2 days os spotting), however less frequent than last year Pelvic discharge or pain: none Breast mass, nipple discharge or skin changes : none Birth control: none Last PAP:     Component Value Date/Time   DIAGPAP  08/13/2022 0920    - Negative for intraepithelial lesion or malignancy (NILM)   DIAGPAP  08/12/2021 1006    - Negative for intraepithelial lesion or malignancy (NILM)   HPVHIGH Negative 08/13/2022 0920   ADEQPAP  08/13/2022 0920    Satisfactory for evaluation; transformation zone component PRESENT.   ADEQPAP  08/12/2021 1006    Satisfactory for evaluation; transformation zone component PRESENT.   Last mammogram: never, scheduled Sexually active: no  Exercising: yes, exercises regularly  GYN HISTORY: 2017 CIN 1 (normal PAP, HPV neg 2021-2023) 2023 TVUS with small fibroids  OB History  Gravida Para Term Preterm AB Living  0 0 0 0 0 0  SAB IAB Ectopic Multiple Live Births  0 0 0 0 0    Past Medical History:  Diagnosis Date   Allergy    Anemia    Anxiety    GERD (gastroesophageal reflux disease)     Past Surgical History:  Procedure Laterality Date   MOUTH SURGERY      Current Outpatient Medications on File Prior to Visit  Medication Sig Dispense Refill   Multiple Vitamin (MULTIVITAMIN PO) Take by mouth.     No current facility-administered medications on file prior to visit.    Social History   Socioeconomic History   Marital status: Single    Spouse name: Not on file   Number of children: Not on file   Years of education: Not on file   Highest education level: Not on file   Occupational History   Not on file  Tobacco Use   Smoking status: Never   Smokeless tobacco: Never  Vaping Use   Vaping status: Never Used  Substance and Sexual Activity   Alcohol use: No   Drug use: No   Sexual activity: Not Currently    Partners: Male    Birth control/protection: Abstinence    Comment: 1st intercourse- 32, partners - 1  Other Topics Concern   Not on file  Social History Narrative   Not on file   Social Determinants of Health   Financial Resource Strain: Not on file  Food Insecurity: Not on file  Transportation Needs: Not on file  Physical Activity: Not on file  Stress: Not on file  Social Connections: Not on file  Intimate Partner Violence: Not on file    Family History  Problem Relation Age of Onset   Hypertension Father    Hyperlipidemia Father    Diabetes Father    Diabetes Maternal Grandmother    Cancer Maternal Grandfather        lung   Diabetes Maternal Aunt    Hypertension Maternal Aunt    Diabetes Maternal Uncle    Hypertension Maternal Uncle    Cancer Paternal Aunt        ovarian   Diabetes Paternal Aunt    Hypertension Paternal Aunt    Diabetes Paternal Uncle    Hypertension Paternal Uncle  No Known Allergies    PE Today's Vitals   08/20/23 0937  BP: 124/84  Pulse: 90  SpO2: 100%  Weight: 146 lb (66.2 kg)  Height: 5' 6.5" (1.689 m)   Body mass index is 23.21 kg/m.  Physical Exam Vitals reviewed. Exam conducted with a chaperone present.  Constitutional:      General: She is not in acute distress.    Appearance: Normal appearance.  HENT:     Head: Normocephalic and atraumatic.     Nose: Nose normal.  Eyes:     Extraocular Movements: Extraocular movements intact.     Conjunctiva/sclera: Conjunctivae normal.  Neck:     Thyroid: No thyroid mass, thyromegaly or thyroid tenderness.  Pulmonary:     Effort: Pulmonary effort is normal.  Chest:     Chest wall: No mass or tenderness.  Breasts:    Right: Normal.  No swelling, mass, nipple discharge, skin change or tenderness.     Left: Normal. No swelling, mass, nipple discharge, skin change or tenderness.  Abdominal:     General: There is no distension.     Palpations: Abdomen is soft.     Tenderness: There is no abdominal tenderness.  Genitourinary:    General: Normal vulva.     Exam position: Lithotomy position.     Urethra: No prolapse.     Vagina: Normal. No vaginal discharge or bleeding.     Cervix: Normal. No lesion.     Uterus: Normal. Not enlarged and not tender.      Adnexa: Right adnexa normal and left adnexa normal.  Musculoskeletal:        General: Normal range of motion.     Cervical back: Normal range of motion.  Lymphadenopathy:     Upper Body:     Right upper body: No axillary adenopathy.     Left upper body: No axillary adenopathy.     Lower Body: No right inguinal adenopathy. No left inguinal adenopathy.  Skin:    General: Skin is warm and dry.  Neurological:     General: No focal deficit present.     Mental Status: She is alert.  Psychiatric:        Mood and Affect: Mood normal.        Behavior: Behavior normal.       Assessment and Plan:        Well woman exam with routine gynecological exam Assessment & Plan: Cervical cancer screening performed according to ASCCP guidelines. Encouraged annual mammogram screening Labs and immunizations with her primary Encouraged safe sexual practices as indicated Encouraged healthy lifestyle practices with diet and exercise For patients under 50yo, I recommend 1000mg  calcium daily and 600IU of vitamin D daily.   Orders: -     CBC  Dysplasia of cervix, low grade (CIN 1) Assessment & Plan: 2017 CIN 1 Normal PAP, HPV neg 2021-2023 Repeat PAP in 3-5 years   Dysmenorrhea Assessment & Plan: Sx well controlled with tylenol   Irregular periods Assessment & Plan: S/p w/[place] [address] Normal TSH, PRL, CBC, PAP, GC/CT TVUS with small fibroids Patient with improvement in  symptoms this year Discussed need for EMB with worsening or changing symptoms     Rosalyn Gess, MD

## 2023-08-20 NOTE — Assessment & Plan Note (Signed)
Cervical cancer screening performed according to ASCCP guidelines. Encouraged annual mammogram screening Labs and immunizations with her primary Encouraged safe sexual practices as indicated Encouraged healthy lifestyle practices with diet and exercise For patients under 40yo, I recommend 1000mg  calcium daily and 600IU of vitamin D daily.

## 2023-08-20 NOTE — Assessment & Plan Note (Signed)
Sx well controlled with tylenol

## 2023-08-20 NOTE — Patient Instructions (Signed)

## 2023-09-08 ENCOUNTER — Ambulatory Visit
Admission: RE | Admit: 2023-09-08 | Discharge: 2023-09-08 | Disposition: A | Discharge status: Home / Self Care | Payer: 59 | Source: Ambulatory Visit | Attending: Obstetrics and Gynecology

## 2023-09-08 DIAGNOSIS — Z0000 Encounter for general adult medical examination without abnormal findings: Secondary | ICD-10-CM

## 2024-08-11 ENCOUNTER — Other Ambulatory Visit: Payer: Self-pay | Admitting: Obstetrics and Gynecology

## 2024-08-11 DIAGNOSIS — Z1231 Encounter for screening mammogram for malignant neoplasm of breast: Secondary | ICD-10-CM

## 2024-08-21 ENCOUNTER — Ambulatory Visit: Payer: 59 | Admitting: Obstetrics and Gynecology

## 2024-09-04 ENCOUNTER — Ambulatory Visit: Admitting: Obstetrics and Gynecology

## 2024-09-18 ENCOUNTER — Ambulatory Visit
Admission: RE | Admit: 2024-09-18 | Discharge: 2024-09-18 | Disposition: A | Source: Ambulatory Visit | Attending: Obstetrics and Gynecology

## 2024-09-18 ENCOUNTER — Ambulatory Visit: Admitting: Obstetrics and Gynecology

## 2024-09-18 DIAGNOSIS — Z1231 Encounter for screening mammogram for malignant neoplasm of breast: Secondary | ICD-10-CM

## 2024-09-26 ENCOUNTER — Ambulatory Visit: Payer: Self-pay | Admitting: Obstetrics and Gynecology

## 2024-10-13 ENCOUNTER — Ambulatory Visit: Admitting: Obstetrics and Gynecology

## 2024-11-30 ENCOUNTER — Ambulatory Visit: Payer: Self-pay | Admitting: Obstetrics and Gynecology

## 2024-11-30 ENCOUNTER — Encounter: Payer: Self-pay | Admitting: Obstetrics and Gynecology

## 2024-11-30 VITALS — BP 144/82 | HR 108 | Temp 98.2°F | Ht 67.0 in | Wt 153.0 lb

## 2024-11-30 DIAGNOSIS — Z01419 Encounter for gynecological examination (general) (routine) without abnormal findings: Secondary | ICD-10-CM | POA: Diagnosis not present

## 2024-11-30 DIAGNOSIS — N939 Abnormal uterine and vaginal bleeding, unspecified: Secondary | ICD-10-CM | POA: Insufficient documentation

## 2024-11-30 DIAGNOSIS — D219 Benign neoplasm of connective and other soft tissue, unspecified: Secondary | ICD-10-CM

## 2024-11-30 DIAGNOSIS — Z124 Encounter for screening for malignant neoplasm of cervix: Secondary | ICD-10-CM

## 2024-11-30 NOTE — Assessment & Plan Note (Signed)
 Will complete hormonal work-up, US  and EMB. First line therapies reviewed, including hormonal therapy and tranexamic acid.  RTO for US  +/- EMB. Will discuss further management at that time.

## 2024-11-30 NOTE — Addendum Note (Signed)
 Addended by: DALLIE BOLLARD V on: 11/30/2024 12:22 PM   Modules accepted: Orders

## 2024-11-30 NOTE — Progress Notes (Addendum)
 "  44 y.o. G0P0000 female with AUB-F, hx of CIN 1 (2017) here for annual exam. Single. Lives in [place]. PCP: Patient, No Pcp Per  Patient's last menstrual period was 11/15/2024 (exact date). Period Duration (Days): 7 Period Pattern: (!) Irregular Menstrual Flow: Heavy Menstrual Control: Maxi pad, Tampon Dysmenorrhea: (!) Moderate Dysmenorrhea Symptoms: Cramping, Diarrhea, Headache  She reports concerns with having two cycles in one month, intermittent spotting after certain exercises.  Bleed for 20d/31d in December. Feels this has been happening for 40yr+. Some night sweats, insomnia, itchy ears, some mood changes.  Feels work nhas been more stressful recently.   Abnormal bleeding: as noted above Pelvic discharge or pain: none Breast mass, nipple discharge or skin changes : none Birth control: none Last PAP:     Component Value Date/Time   DIAGPAP  08/13/2022 0920    - Negative for intraepithelial lesion or malignancy (NILM)   DIAGPAP  08/12/2021 1006    - Negative for intraepithelial lesion or malignancy (NILM)   HPVHIGH Negative 08/13/2022 0920   ADEQPAP  08/13/2022 0920    Satisfactory for evaluation; transformation zone component PRESENT.   ADEQPAP  08/12/2021 1006    Satisfactory for evaluation; transformation zone component PRESENT.   Last mammogram: 09/18/24 BIRADS 1, density d Sexually active: no  Exercising: yes, exercises regularly 4d/wk   GYN HISTORY: 2017 CIN 1 (normal PAP, HPV neg 2021-2023) 2023 TVUS with small fibroids  OB History  Gravida Para Term Preterm AB Living  0 0 0 0 0 0  SAB IAB Ectopic Multiple Live Births  0 0 0 0 0    Past Medical History:  Diagnosis Date   Allergy    Anemia    Anxiety    GERD (gastroesophageal reflux disease)     Past Surgical History:  Procedure Laterality Date   MOUTH SURGERY      Current Outpatient Medications on File Prior to Visit  Medication Sig Dispense Refill   Ferrous Sulfate (IRON PO) Take by  mouth.     Multiple Vitamin (MULTIVITAMIN PO) Take by mouth.     Probiotic Product (PROBIOTIC PO) Take by mouth.     No current facility-administered medications on file prior to visit.    Social History   Socioeconomic History   Marital status: Single    Spouse name: Not on file   Number of children: Not on file   Years of education: Not on file   Highest education level: Not on file  Occupational History   Not on file  Tobacco Use   Smoking status: Never   Smokeless tobacco: Never  Vaping Use   Vaping status: Never Used  Substance and Sexual Activity   Alcohol use: No   Drug use: No   Sexual activity: Not Currently    Partners: Male    Birth control/protection: Abstinence    Comment: 1st intercourse- 32, partners - 1  Other Topics Concern   Not on file  Social History Narrative   Not on file   Social Drivers of Health   Tobacco Use: Low Risk (11/30/2024)   Patient History    Smoking Tobacco Use: Never    Smokeless Tobacco Use: Never    Passive Exposure: Not on file  Financial Resource Strain: Not on file  Food Insecurity: Not on file  Transportation Needs: Not on file  Physical Activity: Not on file  Stress: Not on file  Social Connections: Not on file  Intimate Partner Violence: Not on file  Depression (  PHQ2-9): Not on file  Alcohol Screen: Not on file  Housing: Not on file  Utilities: Not on file  Health Literacy: Not on file    Family History  Problem Relation Age of Onset   Hypertension Father    Hyperlipidemia Father    Diabetes Father    Diabetes Maternal Grandmother    Cancer Maternal Grandfather        lung   Diabetes Maternal Aunt    Hypertension Maternal Aunt    Diabetes Maternal Uncle    Hypertension Maternal Uncle    Cancer Paternal Aunt        ovarian   Diabetes Paternal Aunt    Hypertension Paternal Aunt    Diabetes Paternal Uncle    Hypertension Paternal Uncle     No Known Allergies    PE Today's Vitals   11/30/24 1136  11/30/24 1200  BP: 136/80 (!) 144/82  Pulse: (!) 108   Temp: 98.2 F (36.8 C)   TempSrc: Oral   SpO2: 99%   Weight: 153 lb (69.4 kg)   Height: 5' 7 (1.702 m)     Body mass index is 23.96 kg/m.  Physical Exam Vitals reviewed. Exam conducted with a chaperone present.  Constitutional:      General: She is not in acute distress.    Appearance: Normal appearance.  HENT:     Head: Normocephalic and atraumatic.     Nose: Nose normal.  Eyes:     Extraocular Movements: Extraocular movements intact.     Conjunctiva/sclera: Conjunctivae normal.  Pulmonary:     Effort: Pulmonary effort is normal.  Chest:     Chest wall: No mass or tenderness.  Breasts:    Right: Normal. No swelling, mass, nipple discharge, skin change or tenderness.     Left: Normal. No swelling, mass, nipple discharge, skin change or tenderness.  Abdominal:     General: There is no distension.     Palpations: Abdomen is soft.     Tenderness: There is no abdominal tenderness.  Genitourinary:    General: Normal vulva.     Exam position: Lithotomy position.     Urethra: No prolapse.     Vagina: Normal. No vaginal discharge or bleeding.     Cervix: Normal. No lesion.     Uterus: Normal. Not enlarged and not tender.      Adnexa: Right adnexa normal and left adnexa normal.  Musculoskeletal:        General: Normal range of motion.  Lymphadenopathy:     Upper Body:     Right upper body: No axillary adenopathy.     Left upper body: No axillary adenopathy.     Lower Body: No right inguinal adenopathy. No left inguinal adenopathy.  Skin:    General: Skin is warm and dry.  Neurological:     General: No focal deficit present.     Mental Status: She is alert.  Psychiatric:        Mood and Affect: Mood normal.        Behavior: Behavior normal.      Assessment and Plan:        Well woman exam with routine gynecological exam Assessment & Plan: Elevated BP, recommend establishing with a PCP Cervical cancer  screening performed according to ASCCP guidelines. Encouraged annual mammogram screening Labs and immunizations with her primary Encouraged safe sexual practices as indicated Encouraged healthy lifestyle practices with diet and exercise For patients under 50yo, I recommend 1000mg  calcium daily and  600IU of vitamin D daily.   Orders: -     VITAMIN D 25 Hydroxy (Vit-D Deficiency, Fractures) -     Hemoglobin A1C w/out eAG -     COMPLETE METABOLIC PANEL WITHOUT GFR -     Lipid panel -     Ambulatory Referral to Primary Care (Establish Care)  Abnormal uterine bleeding (AUB) Assessment & Plan: Will complete hormonal work-up, US  and EMB. First line therapies reviewed, including hormonal therapy and tranexamic acid.  RTO for US  +/- EMB. Will discuss further management at that time.   Orders: -     CBC -     Thyroid Panel With TSH -     FSH/LH -     Prolactin -     Testos,Total,Free and SHBG (Female) -     US  PELVIS TRANSVAGINAL NON-OB (TV ONLY); Future -     Endometrial biopsy; Future  Cervical cancer screening -     Cytology - PAP  Fibroids -     US  PELVIS TRANSVAGINAL NON-OB (TV ONLY); Future   Vera LULLA Pa, MD  "

## 2024-11-30 NOTE — Assessment & Plan Note (Addendum)
 Elevated BP, recommend establishing with a PCP Cervical cancer screening performed according to ASCCP guidelines. Encouraged annual mammogram screening Labs and immunizations with her primary Encouraged safe sexual practices as indicated Encouraged healthy lifestyle practices with diet and exercise For patients under 42yo, I recommend 1000mg  calcium daily and 600IU of vitamin D daily.

## 2024-11-30 NOTE — Patient Instructions (Addendum)
 Take 600mg  ibuprofen 30 minutes prior to your appointment.   Health Maintenance, Female Adopting a healthy lifestyle and getting preventive care are important in promoting health and wellness. Ask your health care provider about: The right schedule for you to have regular tests and exams. Things you can do on your own to prevent diseases and keep yourself healthy. What should I know about diet, weight, and exercise? Eat a healthy diet  Eat a diet that includes plenty of vegetables, fruits, low-fat dairy products, and lean protein. Do not eat a lot of foods that are high in solid fats, added sugars, or sodium. Maintain a healthy weight Body mass index (BMI) is used to identify weight problems. It estimates body fat based on height and weight. Your health care provider can help determine your BMI and help you achieve or maintain a healthy weight. Get regular exercise Get regular exercise. This is one of the most important things you can do for your health. Most adults should: Exercise for at least 150 minutes each week. The exercise should increase your heart rate and make you sweat (moderate-intensity exercise). Do strengthening exercises at least twice a week. This is in addition to the moderate-intensity exercise. Spend less time sitting. Even light physical activity can be beneficial. Watch cholesterol and blood lipids Have your blood tested for lipids and cholesterol at 42 years of age, then have this test every 5 years. Have your cholesterol levels checked more often if: Your lipid or cholesterol levels are high. You are older than 42 years of age. You are at high risk for heart disease. What should I know about cancer screening? Depending on your health history and family history, you may need to have cancer screening at various ages. This may include screening for: Breast cancer. Cervical cancer. Colorectal cancer. Skin cancer. Lung cancer. What should I know about heart disease,  diabetes, and high blood pressure? Blood pressure and heart disease High blood pressure causes heart disease and increases the risk of stroke. This is more likely to develop in people who have high blood pressure readings or are overweight. Have your blood pressure checked: Every 3-5 years if you are 54-12 years of age. Every year if you are 38 years old or older. Diabetes Have regular diabetes screenings. This checks your fasting blood sugar level. Have the screening done: Once every three years after age 81 if you are at a normal weight and have a low risk for diabetes. More often and at a younger age if you are overweight or have a high risk for diabetes. What should I know about preventing infection? Hepatitis B If you have a higher risk for hepatitis B, you should be screened for this virus. Talk with your health care provider to find out if you are at risk for hepatitis B infection. Hepatitis C Testing is recommended for: Everyone born from [birth year] through 1963. Anyone with known risk factors for hepatitis C. Sexually transmitted infections (STIs) Get screened for STIs, including gonorrhea and chlamydia, if: You are sexually active and are younger than 42 years of age. You are older than 42 years of age and your health care provider tells you that you are at risk for this type of infection. Your sexual activity has changed since you were last screened, and you are at increased risk for chlamydia or gonorrhea. Ask your health care provider if you are at risk. Ask your health care provider about whether you are at high risk for HIV. Your health  care provider may recommend a prescription medicine to help prevent HIV infection. If you choose to take medicine to prevent HIV, you should first get tested for HIV. You should then be tested every 3 months for as long as you are taking the medicine. Pregnancy If you are about to stop having your period (premenopausal) and you may become pregnant,  seek counseling before you get pregnant. Take 400 to 800 micrograms (mcg) of folic acid every day if you become pregnant. Ask for birth control (contraception) if you want to prevent pregnancy. Osteoporosis and menopause Osteoporosis is a disease in which the bones lose minerals and strength with aging. This can result in bone fractures. If you are 51 years old or older, or if you are at risk for osteoporosis and fractures, ask your health care provider if you should: Be screened for bone loss. Take a calcium or vitamin D supplement to lower your risk of fractures. Be given hormone replacement therapy (HRT) to treat symptoms of menopause. Follow these instructions at home: Alcohol use Do not drink alcohol if: Your health care provider tells you not to drink. You are pregnant, may be pregnant, or are planning to become pregnant. If you drink alcohol: Limit how much you have to: 0-1 drink a day. Know how much alcohol is in your drink. In the [place] one drink equals one 12 oz bottle of beer (355 mL), one 5 oz glass of wine (148 mL), or one 1 oz glass of hard liquor (44 mL). Lifestyle Do not use any products that contain nicotine or tobacco. These products include cigarettes, chewing tobacco, and vaping devices, such as e-cigarettes. If you need help quitting, ask your health care provider. Do not use street drugs. Do not share needles. Ask your health care provider for help if you need support or information about quitting drugs. General instructions Schedule regular health, dental, and eye exams. Stay current with your vaccines. Tell your health care provider if: You often feel depressed. You have ever been abused or do not feel safe at home. This information is not intended to replace advice given to you by your health care provider. Make sure you discuss any questions you have with your health care provider. Document Revised: 04/19/2024 Document Reviewed: 03/03/2021 Elsevier Patient  Education  2025 [employer].

## 2024-12-01 ENCOUNTER — Ambulatory Visit: Payer: Self-pay | Admitting: Obstetrics and Gynecology

## 2024-12-01 LAB — LIPID PANEL
Cholesterol: 121 mg/dL
HDL: 55 mg/dL
LDL Cholesterol (Calc): 53 mg/dL
Non-HDL Cholesterol (Calc): 66 mg/dL
Total CHOL/HDL Ratio: 2.2 (calc)
Triglycerides: 52 mg/dL

## 2024-12-01 LAB — CBC
HCT: 35.4 % — ABNORMAL LOW (ref 35.9–46.0)
Hemoglobin: 11.7 g/dL (ref 11.7–15.5)
MCH: 30.8 pg (ref 27.0–33.0)
MCHC: 33.1 g/dL (ref 31.6–35.4)
MCV: 93.2 fL (ref 81.4–101.7)
MPV: 10.1 fL (ref 7.5–12.5)
Platelets: 382 10*3/uL (ref 140–400)
RBC: 3.8 Million/uL (ref 3.80–5.10)
RDW: 12 % (ref 11.0–15.0)
WBC: 6 10*3/uL (ref 3.8–10.8)

## 2024-12-01 LAB — CYTOLOGY - PAP
Comment: NEGATIVE
Diagnosis: NEGATIVE
High risk HPV: NEGATIVE

## 2024-12-01 LAB — COMPLETE METABOLIC PANEL WITHOUT GFR
AG Ratio: 1.3 (calc) (ref 1.0–2.5)
ALT: 10 U/L (ref 6–29)
AST: 16 U/L (ref 10–30)
Albumin: 4.5 g/dL (ref 3.6–5.1)
Alkaline phosphatase (APISO): 77 U/L (ref 31–125)
BUN: 12 mg/dL (ref 7–25)
CO2: 25 mmol/L (ref 20–32)
Calcium: 9.5 mg/dL (ref 8.6–10.2)
Chloride: 104 mmol/L (ref 98–110)
Creat: 0.69 mg/dL (ref 0.50–0.99)
Globulin: 3.6 g/dL (ref 1.9–3.7)
Glucose, Bld: 76 mg/dL (ref 65–99)
Potassium: 4 mmol/L (ref 3.5–5.3)
Sodium: 138 mmol/L (ref 135–146)
Total Bilirubin: 0.8 mg/dL (ref 0.2–1.2)
Total Protein: 8.1 g/dL (ref 6.1–8.1)

## 2024-12-01 LAB — THYROID PANEL WITH TSH
Free Thyroxine Index: 1.9 (ref 1.4–3.8)
T3 Uptake: 28 % (ref 22–35)
T4, Total: 6.7 ug/dL (ref 5.1–11.9)
TSH: 1.09 m[IU]/L

## 2024-12-01 LAB — VITAMIN D 25 HYDROXY (VIT D DEFICIENCY, FRACTURES): Vit D, 25-Hydroxy: 36 ng/mL (ref 30–100)

## 2024-12-01 LAB — FSH/LH
FSH: 3.7 m[IU]/mL
LH: 13.2 m[IU]/mL

## 2024-12-01 LAB — HEMOGLOBIN A1C: Hgb A1c MFr Bld: 4.9 %

## 2024-12-01 LAB — PROLACTIN: Prolactin: 8.8 ng/mL

## 2024-12-13 ENCOUNTER — Other Ambulatory Visit: Admitting: Obstetrics and Gynecology

## 2024-12-13 ENCOUNTER — Other Ambulatory Visit

## 2025-12-03 ENCOUNTER — Ambulatory Visit: Admitting: Obstetrics and Gynecology
